# Patient Record
Sex: Male | Born: 1995 | Race: Black or African American | Hispanic: No | Marital: Single | Smoking: Never smoker
Health system: Southern US, Community
[De-identification: ages and names within clinical notes are randomized; demographics above are authoritative.]

## PROBLEM LIST (undated history)

## (undated) DIAGNOSIS — G44009 Cluster headache syndrome, unspecified, not intractable: Secondary | ICD-10-CM

## (undated) DIAGNOSIS — F419 Anxiety disorder, unspecified: Secondary | ICD-10-CM

## (undated) DIAGNOSIS — K219 Gastro-esophageal reflux disease without esophagitis: Secondary | ICD-10-CM

## (undated) DIAGNOSIS — T7840XA Allergy, unspecified, initial encounter: Secondary | ICD-10-CM

## (undated) HISTORY — DX: Allergy, unspecified, initial encounter: T78.40XA

## (undated) HISTORY — DX: Anxiety disorder, unspecified: F41.9

## (undated) HISTORY — DX: Gastro-esophageal reflux disease without esophagitis: K21.9

---
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
## 2014-03-12 ENCOUNTER — Encounter: Payer: Self-pay | Admitting: Family Medicine

## 2014-03-12 ENCOUNTER — Emergency Department: Payer: Self-pay | Admitting: Emergency Medicine

## 2014-03-12 ENCOUNTER — Ambulatory Visit (INDEPENDENT_AMBULATORY_CARE_PROVIDER_SITE_OTHER): Payer: PRIVATE HEALTH INSURANCE | Admitting: Family Medicine

## 2014-03-12 VITALS — BP 117/65 | HR 70 | Temp 98.3°F | Resp 20 | Wt 202.0 lb

## 2014-03-12 DIAGNOSIS — M75100 Unspecified rotator cuff tear or rupture of unspecified shoulder, not specified as traumatic: Secondary | ICD-10-CM

## 2014-03-12 DIAGNOSIS — M67919 Unspecified disorder of synovium and tendon, unspecified shoulder: Secondary | ICD-10-CM

## 2014-03-12 DIAGNOSIS — M719 Bursopathy, unspecified: Secondary | ICD-10-CM

## 2014-03-12 LAB — CBC
HCT: 40.6 % (ref 40.0–52.0)
HGB: 13.7 g/dL (ref 13.0–18.0)
MCH: 28.3 pg (ref 26.0–34.0)
MCHC: 33.7 g/dL (ref 32.0–36.0)
MCV: 84 fL (ref 80–100)
Platelet: 177 10*3/uL (ref 150–440)
RBC: 4.83 10*6/uL (ref 4.40–5.90)
RDW: 13 % (ref 11.5–14.5)
WBC: 3.9 10*3/uL (ref 3.8–10.6)

## 2014-03-12 LAB — URINALYSIS, COMPLETE
Bacteria: NONE SEEN
Bilirubin,UR: NEGATIVE
Blood: NEGATIVE
GLUCOSE, UR: NEGATIVE mg/dL (ref 0–75)
LEUKOCYTE ESTERASE: NEGATIVE
Nitrite: NEGATIVE
PH: 5 (ref 4.5–8.0)
PROTEIN: NEGATIVE
SQUAMOUS EPITHELIAL: NONE SEEN
Specific Gravity: 1.031 (ref 1.003–1.030)

## 2014-03-12 LAB — BASIC METABOLIC PANEL
Anion Gap: 7 (ref 7–16)
BUN: 13 mg/dL (ref 9–21)
CALCIUM: 8.4 mg/dL — AB (ref 9.0–10.7)
Chloride: 106 mmol/L (ref 97–107)
Co2: 27 mmol/L — ABNORMAL HIGH (ref 16–25)
Creatinine: 1.03 mg/dL (ref 0.60–1.30)
EGFR (African American): >60
EGFR (Non-African Amer.): >60
Glucose: 86 mg/dL (ref 65–99)
Osmolality: 279 (ref 275–301)
POTASSIUM: 3.5 mmol/L (ref 3.3–4.7)
SODIUM: 140 mmol/L (ref 132–141)

## 2014-03-12 NOTE — Assessment & Plan Note (Signed)
The rotator cuff injury with signs of impingement, full range of motion With limitation of his usual function I offered steroid injection and perform that today. Discussed with him that this would be reasonable once, if he continues to have pain or has worsening pain would recommend sending him to sports medicine for further evaluation I also gave him a home exercise plan, and cautioned him against any exercises that hurt his shoulder

## 2014-03-12 NOTE — Progress Notes (Signed)
Patient ID: Vincent Valenzuela, male   DOB: 01/01/1996, 18 y.o.   MRN: [id number]  Vincent FentonSamuel Bradshaw, MD Phone: [phone]  Subjective:  Chief complaint-noted  Pt Here for shoulder pain  He states the shoulder began hurting about 2 months ago after a mild injury during basketball game. He felt a slight pain whenever he is passing a ball Planning, and then had severe pain afterwards. He states the pain has continued to hurt across his anterior shoulder with activities and now he feels he has reduced range of motion. 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 Other than the basketball injury is her memory any other injuries.  ROS-  Per history of present illness  Past Medical History Patient Active Problem List   Diagnosis Date Noted  . Back pain 10/04/2013  . Social anxiety disorder 06/14/2013    Medications- reviewed and updated Current Outpatient Prescriptions  Medication Sig Dispense Refill  . Multiple Vitamins-Minerals (MULTIVITAMINS THER. W/MINERALS) TABS Take 1 tablet by mouth daily.       No current facility-administered medications for this visit.    Objective: BP 117/65  Pulse 70  Temp(Src) 98.3 F (36.8 C) (Oral)  Resp 20  Wt 202 lb (91.627 kg)  SpO2 100% Gen: NAD, alert, cooperative with exam HEENT: NCAT, MSK: Mild tenderness to palpation across the anterior area of the shoulder No gross deformity or redness or injury. Pain with Hawkins and empty can test Full range of motion with forward flexion, abduction, and Apley scratch test No pain with external or internal rotation   Informed consent obtained and placed in chart.  Time out performed.  Area cleaned with alcohol X 2.  Using 21 1/2 gauge needle 1 cc Kenalog and 4 cc's 1% Lidocaine were injected in subacromial space via posterior approach.  Sterile bandage placed.  Patient tolerated procedure well.  No complications.    Assessment/Plan:  Rotator cuff syndrome The  rotator cuff injury with signs of impingement, full range of motion With limitation of his usual function I offered steroid injection and perform that today. Discussed with him that this would be reasonable once, if he continues to have pain or has worsening pain would recommend sending him to sports medicine for further evaluation I also gave him a home exercise plan, and cautioned him against any exercises that hurt his shoulder

## 2014-03-12 NOTE — Patient Instructions (Signed)
Great to see you today!  You have rotator cuff syndrome, the shot will help, also do the exercises.

## 2014-06-08 ENCOUNTER — Emergency Department: Payer: Self-pay | Admitting: Emergency Medicine

## 2015-06-26 ENCOUNTER — Ambulatory Visit (INDEPENDENT_AMBULATORY_CARE_PROVIDER_SITE_OTHER): Payer: PRIVATE HEALTH INSURANCE | Admitting: Family Medicine

## 2015-06-26 ENCOUNTER — Encounter: Payer: Self-pay | Admitting: Family Medicine

## 2015-06-26 VITALS — BP 124/54 | HR 52 | Temp 98.1°F | Wt 185.0 lb

## 2015-06-26 DIAGNOSIS — S46812A Strain of other muscles, fascia and tendons at shoulder and upper arm level, left arm, initial encounter: Secondary | ICD-10-CM | POA: Diagnosis not present

## 2015-06-26 DIAGNOSIS — F4010 Social phobia, unspecified: Secondary | ICD-10-CM | POA: Diagnosis not present

## 2015-06-26 MED ORDER — MELOXICAM 15 MG PO TABS: 15.0000 mg | ORAL_TABLET | Freq: Every day | ORAL | Status: DC

## 2015-06-26 NOTE — Patient Instructions (Signed)
Thank you for coming in today!  We are not concerned about any potential bone injury.You have left sided trapezius spasm. Meloxicam can help to decrease the inflammation. Take one a day until the pain is resolved. You are okay to wrestle from a medical stand point the only thing is that the pain may be aggravated and this is your choice to make.  For your anxiety make an appointment with Dr. Cheron Schaumann at the front desk.   Thank you again.    Soft Tissue Injury of the Neck A soft tissue injury of the neck may be either blunt or penetrating. A blunt injury does not break the skin. A penetrating injury breaks the skin, creating an open wound. Blunt injuries may happen in several ways. Most involve some type of direct blow to the neck. This can cause serious injury to the windpipe, voice box, cervical spine, or esophagus. In some cases, the injury to the soft tissue can also result in a break (fracture) of the cervical spine.  Soft tissue injuries of the neck require immediate medical care. Sometimes, you may not notice the signs of injury right away. You may feel fine at first, but the swelling may eventually close off your airway. This could result in a significant or life-threatening injury. This is rare, but it is important to keep in mind with any injury to the neck.  CAUSES  Causes of blunt injury may include:  "Clothesline" injuries. This happens when someone is moving at high speed and runs into a clothesline, outstretched arm, or similar object. This results in a direct injury to the front of the neck. If the airway is blocked, it can cause suffocation due to lack of oxygen (asphyxiation) or even instant death.  High-energy trauma. This includes injuries from motor vehicle crashes, falling from a great height, or heavy objects falling onto the neck.  Sports-related injuries. Injury to the windpipe and voice box can result from being struck by another player or being struck by an object,  such as a baseball, hockey stick, or an outstretched arm.  Strangulation. This type of injury may cause skin trauma, hoarseness of voice, or broken cartilage in the voice box or windpipe. It may also cause a serious airway problem. SYMPTOMS   Bruising.  Pain and tenderness in the neck.  Swelling of the neck and face.  Hoarseness of voice.  Pain or difficulty with swallowing.  Drooling or inability to swallow.  Trouble breathing. This may become worse when lying flat.  Coughing up blood.  High-pitched, harsh, vibratory noise due to partial obstruction of the windpipe (stridor).  Swelling of the upper arms.  Windpipe that appears to be pushed off to one side.  Air in the tissues under the skin of the neck or chest (subcutaneous emphysema). This usually indicates a problem with the normal airway and is a medical emergency. DIAGNOSIS   If possible, your caregiver may ask about the details of how the injury occurred. A detailed exam can help to identify specific areas of the neck that are injured.  Your caregiver may ask for tests to rule out injury of the voice box, airway, or esophagus. This may include X-rays, ultrasounds, CT scans, or MRI scans, depending on the severity of your injury. TREATMENT  If you have an injury to your windpipe or voice box, immediate medical care is required. In almost all cases, hospitalization is necessary. For injuries that do not appear to require surgery, it is helpful to have medical  observation for 24 hours. You may be asked to do one or more of the following:  Rest your voice.  Bed rest.  Limit your diet, depending on the extent of the injury. Follow your caregiver's dietary guidelines. Often, only fluids and soft foods are recommended.  Keep your head raised.  Breathe humidified air.  Take medicines to control infection, reduce swelling, and reduce normal stomach acid. You may also need pain medicine, depending on your injury. For  injuries that appear to require surgery, you will need to stay in the hospital. The exact type of procedure needed will depend on your exact injury or injuries.  HOME CARE INSTRUCTIONS   If the skin was broken, keep the wound area clean and dry. Wear your bandage (dressing) and care for your wound as instructed.  Follow your caregiver's advice about your diet.  Follow your caregiver's advice about use of your voice.  Take medicines as directed.  Keep your head and neck at least partially raised (elevated) while recovering. This should also be done while sleeping. SEEK MEDICAL CARE IF:   Your voice becomes weaker.  Your swelling or bruising is not improving as expected. Typically, this takes several days to improve.  You feel that you are having problems with medicines prescribed.  You have drainage from the injury site. This may be a sign that your wound is not healing properly or is infected.  You develop increasing pain or difficulty while swallowing.  You develop an oral temperature of 102 F (38.9 C) or higher. SEEK IMMEDIATE MEDICAL CARE IF:   You cough up blood.  You develop sudden trouble breathing.  You cannot tolerate your oral medicines, or you are unable to swallow.  You develop drooling.  You have new or worsening vomiting.  You develop sudden, new swelling of the neck or face.  You have an oral temperature above 102 F (38.9 C), not controlled by medicine. MAKE SURE YOU:  Understand these instructions.  Will watch your condition.  Will get help right away if you are not doing well or get worse. Document Released: 12/20/2007 Document Revised: 12/05/2011 Document Reviewed: 11/29/2010 [facility] Patient Information 2015 [place], [place]. This information is not intended to replace advice given to you by your health care provider. Make sure you discuss any questions you have with your health care provider.

## 2015-06-26 NOTE — Assessment & Plan Note (Signed)
Patient was asking about help/medication for his anxiety. At this time I do not believe that it is warranted to treat this anxiety as patient has only had a recurrence of symptoms over the past 24 hours. In our office there is no indication of debilitating ailment.   I have asked that he follow-up with his primary care provider to have further workup on this matter.
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
## 2015-06-26 NOTE — Assessment & Plan Note (Addendum)
Upper Level Addendum:  I have seen and evaluated this patient along with this medical student and reviewed the complete note. I have made any necessary additions/revisions to the note and physical exam.   Patient signs and symptoms most consistent this time with trapezius strain, left-sided. There are no symptoms presenting at this time suggesting of bony or neurological compromise. I prescribed patient meloxicam 15 mg. He is to take this daily for the next 5 days, then when necessary after that. I've encouraged him to use ice and with heat liberally and regularly for pain/discomfort. Patient was concerned about his clearance to wrestle this weekend. At this time there is no indication for increased risk of injury as long as patient's pain was minimal and range of motion was full.  Kathee Delton, MD,MS,  PGY2 06/26/2015 9:08 PM

## 2015-09-18 ENCOUNTER — Emergency Department (HOSPITAL_COMMUNITY)
Admission: EM | Admit: 2015-09-18 | Discharge: 2015-09-18 | Disposition: A | Discharge status: Home / Self Care | Payer: PRIVATE HEALTH INSURANCE | Attending: Emergency Medicine | Admitting: Emergency Medicine

## 2015-09-18 ENCOUNTER — Emergency Department (HOSPITAL_COMMUNITY): Payer: PRIVATE HEALTH INSURANCE

## 2015-09-18 ENCOUNTER — Encounter (HOSPITAL_COMMUNITY): Payer: Self-pay | Admitting: Family Medicine

## 2015-09-18 DIAGNOSIS — Z8669 Personal history of other diseases of the nervous system and sense organs: Secondary | ICD-10-CM | POA: Insufficient documentation

## 2015-09-18 DIAGNOSIS — Z8659 Personal history of other mental and behavioral disorders: Secondary | ICD-10-CM | POA: Insufficient documentation

## 2015-09-18 DIAGNOSIS — Z8719 Personal history of other diseases of the digestive system: Secondary | ICD-10-CM | POA: Insufficient documentation

## 2015-09-18 DIAGNOSIS — R1011 Right upper quadrant pain: Secondary | ICD-10-CM | POA: Insufficient documentation

## 2015-09-18 DIAGNOSIS — Z79899 Other long term (current) drug therapy: Secondary | ICD-10-CM | POA: Insufficient documentation

## 2015-09-18 LAB — URINALYSIS, ROUTINE W REFLEX MICROSCOPIC
Bilirubin Urine: NEGATIVE
GLUCOSE, UA: NEGATIVE mg/dL
Hgb urine dipstick: NEGATIVE
KETONES UR: NEGATIVE mg/dL
LEUKOCYTES UA: NEGATIVE
NITRITE: NEGATIVE
PROTEIN: NEGATIVE mg/dL
Specific Gravity, Urine: 1.024 (ref 1.005–1.030)
pH: 7 (ref 5.0–8.0)

## 2015-09-18 LAB — CBC
HCT: 40.9 % (ref 39.0–52.0)
Hemoglobin: 13.4 g/dL (ref 13.0–17.0)
MCH: 27.8 pg (ref 26.0–34.0)
MCHC: 32.8 g/dL (ref 30.0–36.0)
MCV: 84.9 fL (ref 78.0–100.0)
PLATELETS: 223 10*3/uL (ref 150–400)
RBC: 4.82 MIL/uL (ref 4.22–5.81)
RDW: 13.2 % (ref 11.5–15.5)
WBC: 6.6 10*3/uL (ref 4.0–10.5)

## 2015-09-18 LAB — COMPREHENSIVE METABOLIC PANEL
ALK PHOS: 48 U/L (ref 38–126)
ALT: 14 U/L — AB (ref 17–63)
AST: 17 U/L (ref 15–41)
Albumin: 4.1 g/dL (ref 3.5–5.0)
Anion gap: 7 (ref 5–15)
BILIRUBIN TOTAL: 0.8 mg/dL (ref 0.3–1.2)
BUN: 14 mg/dL (ref 6–20)
CALCIUM: 9.6 mg/dL (ref 8.9–10.3)
CO2: 29 mmol/L (ref 22–32)
CREATININE: 1 mg/dL (ref 0.61–1.24)
Chloride: 107 mmol/L (ref 101–111)
GFR calc Af Amer: >60 mL/min (ref >60)
Glucose, Bld: 98 mg/dL (ref 65–99)
Potassium: 3.8 mmol/L (ref 3.5–5.1)
Sodium: 143 mmol/L (ref 135–145)
TOTAL PROTEIN: 7.6 g/dL (ref 6.5–8.1)

## 2015-09-18 LAB — LIPASE, BLOOD: Lipase: 25 U/L (ref 11–51)

## 2015-09-18 MED ORDER — ONDANSETRON HCL 4 MG PO TABS: 4.0000 mg | ORAL_TABLET | Freq: Four times a day (QID) | ORAL | Status: DC

## 2015-09-18 NOTE — ED Provider Notes (Signed)
CSN: [id number]     Arrival date & time 09/18/15  1252 History   First MD Initiated Contact with Patient 09/18/15 1322     Chief Complaint  Patient presents with  . Abdominal Pain    HPI   19 year old male presents today with abdominal pain. Patient reports symptoms started approximately one week ago with a slow onset intermittent stabbing sensation throughout his abdomen. He reports symptoms are more consistent in the right upper quadrant, but appear throughout his abdomen diffusely. He reports symptoms wax and wane, and are made worse from going sitting to standing or walking. He reports 2 days ago he had an episode of vomiting, denies any associated fever, chills, upper respiratory complaints, cough, shortness of breath or worsening abdominal pain at that time. He reports abdominal pain is not made worse by eating or drinking, or having bowel movements. He reports normal bowel movements with no changes in the color clarity or characteristics of urine or bowel movements. Patient denies any alcohol or drug use, reports using ibuprofen for pain with no significant improvement. 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   Past Medical History  Diagnosis Date  . Headaches, cluster   . Allergy   . Anxiety   . GERD (gastroesophageal reflux disease)    History reviewed. No pertinent past surgical history. Family History  Problem Relation Age of Onset  . Lupus Mother   . Depression Mother   . Depression Father   . Alcohol abuse Father   . Hypertension Father   . Lupus Maternal Grandmother   . Diabetes Maternal Grandmother   . Hypertension Maternal Grandmother   . Hyperlipidemia Maternal Grandmother   . Diabetes Maternal Grandfather   . Hypertension Maternal Grandfather   . Hyperlipidemia Maternal Grandfather   . Diabetes Paternal Grandmother   . Hypertension Paternal Grandmother   . Hyperlipidemia Paternal Grandmother    . Diabetes Paternal Grandfather   . Hypertension Paternal Grandfather   . Hyperlipidemia Paternal Grandfather    Social History  Substance Use Topics  . Smoking status: Passive Smoke Exposure - Never Smoker  . Smokeless tobacco: None  . Alcohol Use: None    Review of Systems  All other systems reviewed and are negative.   Allergies  Review of patient's allergies indicates no known allergies.  Home Medications   Prior to Admission medications   Medication Sig Start Date End Date Taking? Authorizing Provider  ibuprofen (ADVIL,MOTRIN) 200 MG tablet Take 200 mg by mouth every 6 (six) hours as needed for mild pain.   Yes Historical Provider, MD  Multiple Vitamins-Minerals (MULTIVITAMINS THER. W/MINERALS) TABS Take 1 tablet by mouth daily.   Yes Historical Provider, MD  ondansetron (ZOFRAN) 4 MG tablet Take 1 tablet (4 mg total) by mouth every 6 (six) hours. 09/18/15   Meiya Wisler, PA-C   BP 125/74 mmHg  Pulse 49  Temp(Src) 98 F (36.7 C) (Oral)  Resp 16  SpO2 100%   Physical Exam  Constitutional: He is oriented to person, place, and time. He appears well-developed and well-nourished.  HENT:  Head: Normocephalic and atraumatic.  Eyes: Conjunctivae are normal. Pupils are equal, round, and reactive to light. Right eye exhibits no discharge. Left eye exhibits no discharge. No scleral icterus.  Neck: Normal range of motion. No JVD present. No tracheal deviation present.  Cardiovascular: Regular rhythm, normal heart sounds and intact distal pulses.  Exam reveals no gallop and no  friction rub.   No murmur heard. Pulmonary/Chest: Effort normal and breath sounds normal. No stridor. No respiratory distress. He has no wheezes. He has no rales. He exhibits no tenderness.  Abdominal: Soft. Bowel sounds are normal.  Tenderness to palpation of the abdomen diffusely, no rebound guarding. Patient has more significant tenderness to the right upper quadrant. No distention, masses noted, no  rash or signs trauma to the abdomen.  Musculoskeletal: Normal range of motion. He exhibits no edema or tenderness.  Neurological: He is alert and oriented to person, place, and time. Coordination normal.  Skin: Skin is warm and dry. No rash noted. No erythema. No pallor.  Psychiatric: He has a normal mood and affect. His behavior is normal. Judgment and thought content normal.  Nursing note and vitals reviewed.     ED Course  Procedures (including critical care time) Labs Review Labs Reviewed  COMPREHENSIVE METABOLIC PANEL - Abnormal; Notable for the following:    ALT 14 (*)    All other components within normal limits  LIPASE, BLOOD  CBC  URINALYSIS, ROUTINE W REFLEX MICROSCOPIC (NOT [facility])    Imaging Review [place] Abdomen Complete  09/18/2015  CLINICAL DATA:  Ten days of right upper quadrant abdominal pain EXAM: ABDOMEN ULTRASOUND COMPLETE COMPARISON:  None in PACs FINDINGS: Gallbladder: No gallstones or wall thickening visualized. No sonographic Murphy sign noted by sonographer. Common bile duct: Diameter: 3.4 mm Liver: No focal lesion identified. Within normal limits in parenchymal echogenicity. IVC: No abnormality visualized. Pancreas: Visualized portion unremarkable. Spleen: Size and appearance within normal limits. Right Kidney: Length: 10.5 cm. Echogenicity within normal limits. No mass or hydronephrosis visualized. Left Kidney: Length: 11.2 cm. Echogenicity within normal limits. No mass or hydronephrosis visualized. Abdominal aorta: No aneurysm visualized. Other findings: No ascites is demonstrated. IMPRESSION: No gallstones or other acute intra-abdominal abnormality is demonstrated. If there are strong clinical concerns of gallbladder dysfunction, a nuclear medicine hepatobiliary scan may be useful. Electronically Signed   By: David  SwazilandJordan M.D.   On: 09/18/2015 15:47   I have personally reviewed and evaluated these images and lab results as part of my medical decision-making.    EKG Interpretation None      MDM   Final diagnoses:  Right upper quadrant pain    Labs: Urinalysis, lipase, CMP, CBC- no significant findings  Imaging: Ultrasound abdomen complete- no acute intra-abdominal abnormality  Consults:  Therapeutics:  Discharge Meds:   Assessment/Plan: Patient presents with abdominal pain over the last week. Symptoms most consistent with muscular abdominal pain as this is made worse with walking or going from sitting to standing. Not made worse with ingestion of foods, no satiety diarrhea, fever. Patient did have one episode of vomiting, this is likely an isolated event. Nonsurgical abdomen, no significant findings on ultrasound or laboratory data they would assess take further evaluation or management here in the ED. Patient is instructed to use ibuprofen or Tylenol for discomfort, avoid any heavy lifting activities, follow-up with his primary care provider in 3 days for reevaluation. He is given strict return precautions the event that new or worsening signs or symptoms present, both the patient and his mother who was present at the time of evaluation understood and agreed to today's plan.         Eyvonne MechanicJeffrey Verdell Kincannon, PA-C 09/18/15 1612  Lyndal Pulleyaniel Knott, MD 09/19/15 [phone]

## 2015-09-18 NOTE — ED Notes (Signed)
Patient transported to Ultrasound 

## 2015-09-18 NOTE — ED Notes (Signed)
Pt here for RUQ pain x 1 week with diarrhea and black stools.

## 2015-09-18 NOTE — Discharge Instructions (Signed)
Abdominal Pain, Adult °Many things can cause abdominal pain. Usually, abdominal pain is not caused by a disease and will improve without treatment. It can often be observed and treated at home. Your health care provider will do a physical exam and possibly order blood tests and X-rays to help determine the seriousness of your pain. However, in many cases, more time must pass before a clear cause of the pain can be found. Before that point, your health care provider may not know if you need more testing or further treatment. °HOME CARE INSTRUCTIONS °Monitor your abdominal pain for any changes. The following actions may help to alleviate any discomfort you are experiencing: °· Only take over-the-counter or prescription medicines as directed by your health care provider. °· Do not take laxatives unless directed to do so by your health care provider. °· Try a clear liquid diet (broth, tea, or water) as directed by your health care provider. Slowly move to a bland diet as tolerated. °SEEK MEDICAL CARE IF: °· You have unexplained abdominal pain. °· You have abdominal pain associated with nausea or diarrhea. °· You have pain when you urinate or have a bowel movement. °· You experience abdominal pain that wakes you in the night. °· You have abdominal pain that is worsened or improved by eating food. °· You have abdominal pain that is worsened with eating fatty foods. °· You have a fever. °SEEK IMMEDIATE MEDICAL CARE IF: °· Your pain does not go away within 2 hours. °· You keep throwing up (vomiting). °· Your pain is felt only in portions of the abdomen, such as the right side or the left lower portion of the abdomen. °· You pass bloody or black tarry stools. °MAKE SURE YOU: °· Understand these instructions. °· Will watch your condition. °· Will get help right away if you are not doing well or get worse. °  °This information is not intended to replace advice given to you by your health care provider. Make sure you discuss  any questions you have with your health care provider. °  °Document Released: 06/22/2005 Document Revised: 06/03/2015 Document Reviewed: 05/22/2013 °Elsevier Interactive Patient Education ©2016 [employer] ° °Please read attached information. If you experience any new or worsening signs or symptoms please return to the emergency room for evaluation. Please follow-up with your primary care provider or specialist as discussed. Please use medication prescribed only as directed and discontinue taking if you have any concerning signs or symptoms.  ° °

## 2015-09-24 ENCOUNTER — Emergency Department (HOSPITAL_COMMUNITY): Payer: No Typology Code available for payment source

## 2015-09-24 ENCOUNTER — Emergency Department (HOSPITAL_COMMUNITY)
Admission: EM | Admit: 2015-09-24 | Discharge: 2015-09-24 | Disposition: A | Discharge status: Home / Self Care | Payer: No Typology Code available for payment source | Attending: Emergency Medicine | Admitting: Emergency Medicine

## 2015-09-24 ENCOUNTER — Encounter (HOSPITAL_COMMUNITY): Payer: Self-pay

## 2015-09-24 DIAGNOSIS — Z79899 Other long term (current) drug therapy: Secondary | ICD-10-CM | POA: Insufficient documentation

## 2015-09-24 DIAGNOSIS — Z8669 Personal history of other diseases of the nervous system and sense organs: Secondary | ICD-10-CM | POA: Insufficient documentation

## 2015-09-24 DIAGNOSIS — R079 Chest pain, unspecified: Secondary | ICD-10-CM | POA: Diagnosis present

## 2015-09-24 DIAGNOSIS — R0789 Other chest pain: Secondary | ICD-10-CM | POA: Insufficient documentation

## 2015-09-24 DIAGNOSIS — Z8719 Personal history of other diseases of the digestive system: Secondary | ICD-10-CM | POA: Diagnosis not present

## 2015-09-24 DIAGNOSIS — Z8659 Personal history of other mental and behavioral disorders: Secondary | ICD-10-CM | POA: Diagnosis not present

## 2015-09-24 MED ORDER — CYCLOBENZAPRINE HCL 10 MG PO TABS: 10.0000 mg | ORAL_TABLET | Freq: Two times a day (BID) | ORAL | Status: DC | PRN

## 2015-09-24 MED ORDER — NAPROXEN 500 MG PO TABS: 500.0000 mg | ORAL_TABLET | Freq: Two times a day (BID) | ORAL | Status: DC

## 2015-09-24 NOTE — ED Notes (Signed)
Pt c/o chest pain with shortness of breath. ?loc. Friend states fell to ground.

## 2015-09-24 NOTE — ED Provider Notes (Signed)
CSN: [id number]     Arrival date & time 09/24/15  [id number] History   First MD Initiated Contact with Patient 09/24/15 0740     Chief Complaint  Patient presents with  . Chest Pain     (Consider location/radiation/quality/duration/timing/severity/associated sxs/prior Treatment) HPI...... left anterior chest pain since 4:30 AM described as sharp and burning. No dyspnea, nausea, diaphoresis. He is slightly dizzy. Nonsmoker. No family history of MI. His taken ibuprofen and Zofran. Severity is mild. Nothing makes symptoms better or worse.  Past Medical History  Diagnosis Date  . Headaches, cluster   . Allergy   . Anxiety   . GERD (gastroesophageal reflux disease)    History reviewed. No pertinent past surgical history. Family History  Problem Relation Age of Onset  . Lupus Mother   . Depression Mother   . Depression Father   . Alcohol abuse Father   . Hypertension Father   . Lupus Maternal Grandmother   . Diabetes Maternal Grandmother   . Hypertension Maternal Grandmother   . Hyperlipidemia Maternal Grandmother   . Diabetes Maternal Grandfather   . Hypertension Maternal Grandfather   . Hyperlipidemia Maternal Grandfather   . Diabetes Paternal Grandmother   . Hypertension Paternal Grandmother   . Hyperlipidemia Paternal Grandmother   . Diabetes Paternal Grandfather   . Hypertension Paternal Grandfather   . Hyperlipidemia Paternal Grandfather    Social History  Substance Use Topics  . Smoking status: Passive Smoke Exposure - Never Smoker  . Smokeless tobacco: None  . Alcohol Use: No    Review of Systems  All other systems reviewed and are negative.     Allergies  Review of patient's allergies indicates no known allergies.  Home Medications   Prior to Admission medications   Medication Sig Start Date End Date Taking? Authorizing Provider  ibuprofen (ADVIL,MOTRIN) 200 MG tablet Take 200 mg by mouth every 6 (six) hours as needed for mild pain.   Yes Historical  Provider, MD  Multiple Vitamins-Minerals (MULTIVITAMINS THER. W/MINERALS) TABS Take 1 tablet by mouth daily.   Yes Historical Provider, MD  ondansetron (ZOFRAN) 4 MG tablet Take 1 tablet (4 mg total) by mouth every 6 (six) hours. 09/18/15  Yes Jeffrey Hedges, PA-C  cyclobenzaprine (FLEXERIL) 10 MG tablet Take 1 tablet (10 mg total) by mouth 2 (two) times daily as needed for muscle spasms. 09/24/15   Donnetta HutchingBrian Lanai Conlee, MD  naproxen (NAPROSYN) 500 MG tablet Take 1 tablet (500 mg total) by mouth 2 (two) times daily. 09/24/15   Donnetta HutchingBrian Steele Stracener, MD   BP 120/56 mmHg  Pulse 51  Temp(Src) 98.8 F (37.1 C) (Oral)  Resp 14  SpO2 99% Physical Exam  Constitutional: He is oriented to person, place, and time. He appears well-developed and well-nourished.  HENT:  Head: Normocephalic and atraumatic.  Eyes: Conjunctivae and EOM are normal. Pupils are equal, round, and reactive to light.  Neck: Normal range of motion. Neck supple.  Cardiovascular: Normal rate and regular rhythm.   Pulmonary/Chest: Effort normal and breath sounds normal.  Abdominal: Soft. Bowel sounds are normal.  Musculoskeletal: Normal range of motion.  Neurological: He is alert and oriented to person, place, and time.  Skin: Skin is warm and dry.  Psychiatric: He has a normal mood and affect. His behavior is normal.  Nursing note and vitals reviewed.   ED Course  Procedures (including critical care time) Labs Review Labs Reviewed - No data to display  Imaging Review Dg Chest 2 View  09/24/2015  CLINICAL DATA:  Woke up this morning with LEFT side chest pain, history GERD EXAM: CHEST  2 VIEW COMPARISON:  09/04/2005 FINDINGS: Upper normal heart size. Mediastinal contours and pulmonary vascularity normal. Lungs clear. No pleural effusion or pneumothorax. Osseous mineralization normal without focal bony abnormality. IMPRESSION: No acute abnormalities. Electronically Signed   By: Ulyses Southward M.D.   On: 09/24/2015 08:23   I have personally  reviewed and evaluated these images and lab results as part of my medical decision-making.   EKG Interpretation   Date/Time:  Thursday September 24 2015 07:09:06 EST Ventricular Rate:  52 PR Interval:  132 QRS Duration: 95 QT Interval:  413 QTC Calculation: 384 R Axis:   70 Text Interpretation:  Sinus bradycardia Premature atrial complexes  Otherwise normal ECG Confirmed by DELO  MD, DOUGLAS ([id number]) on 09/24/2015  7:19:37 AM      MDM   Final diagnoses:  Chest wall pain    Patient is in no acute distress. He is low risk for ACS or pulmonary embolism. EKG and chest x-ray normal. Discharge medication Naprosyn 500 mg and Flexeril 10 mg    Donnetta Hutching, MD 09/24/15 1103

## 2015-09-24 NOTE — Discharge Instructions (Signed)
Chest Wall Pain Chest wall pain is pain in or around the bones and muscles of your chest. Sometimes, an injury causes this pain. Sometimes, the cause may not be known. This pain may take several weeks or longer to get better. HOME CARE Pay attention to any changes in your symptoms. Take these actions to help with your pain:  Rest as told by your doctor.  Avoid activities that cause pain. Try not to use your chest, belly (abdominal), or side muscles to lift heavy things.  If directed, apply ice to the painful area:  Put ice in a plastic bag.  Place a towel between your skin and the bag.  Leave the ice on for 20 minutes, 2-3 times per day.  Take over-the-counter and prescription medicines only as told by your doctor.  Do not use tobacco products, including cigarettes, chewing tobacco, and e-cigarettes. If you need help quitting, ask your doctor.  Keep all follow-up visits as told by your doctor. This is important. GET HELP IF:  You have a fever.  Your chest pain gets worse.  You have new symptoms. GET HELP RIGHT AWAY IF:  You feel sick to your stomach (nauseous) or you throw up (vomit).  You feel sweaty or light-headed.  You have a cough with phlegm (sputum) or you cough up blood.  You are short of breath.   This information is not intended to replace advice given to you by your health care provider. Make sure you discuss any questions you have with your health care provider.   Document Released: 02/29/2008 Document Revised: 06/03/2015 Document Reviewed: 12/08/2014 Elsevier Interactive Patient Education 2016 [employer]  EKG and chest x-ray were normal. I suspect the pain is in the wall of your chest. Medication for inflammation and muscle spasm.

## 2015-10-30 ENCOUNTER — Emergency Department (HOSPITAL_COMMUNITY)
Admission: EM | Admit: 2015-10-30 | Discharge: 2015-10-31 | Disposition: A | Discharge status: Home / Self Care | Payer: No Typology Code available for payment source | Attending: Emergency Medicine | Admitting: Emergency Medicine

## 2015-10-30 ENCOUNTER — Encounter (HOSPITAL_COMMUNITY): Payer: Self-pay

## 2015-10-30 DIAGNOSIS — R110 Nausea: Secondary | ICD-10-CM | POA: Diagnosis not present

## 2015-10-30 DIAGNOSIS — Z79899 Other long term (current) drug therapy: Secondary | ICD-10-CM | POA: Diagnosis not present

## 2015-10-30 DIAGNOSIS — Z791 Long term (current) use of non-steroidal anti-inflammatories (NSAID): Secondary | ICD-10-CM | POA: Diagnosis not present

## 2015-10-30 DIAGNOSIS — J029 Acute pharyngitis, unspecified: Secondary | ICD-10-CM | POA: Diagnosis present

## 2015-10-30 DIAGNOSIS — H9209 Otalgia, unspecified ear: Secondary | ICD-10-CM | POA: Diagnosis not present

## 2015-10-30 DIAGNOSIS — Z8719 Personal history of other diseases of the digestive system: Secondary | ICD-10-CM | POA: Insufficient documentation

## 2015-10-30 DIAGNOSIS — J069 Acute upper respiratory infection, unspecified: Secondary | ICD-10-CM | POA: Diagnosis not present

## 2015-10-30 DIAGNOSIS — Z8659 Personal history of other mental and behavioral disorders: Secondary | ICD-10-CM | POA: Diagnosis not present

## 2015-10-30 NOTE — ED Notes (Signed)
See providers assessment.  

## 2015-10-30 NOTE — ED Notes (Signed)
Pt complaining of body aches. States ears and throat are hurting. Denies any fevers or vomiting. Is nauseated.

## 2015-10-31 LAB — RAPID STREP SCREEN (MED CTR MEBANE ONLY): STREPTOCOCCUS, GROUP A SCREEN (DIRECT): NEGATIVE

## 2015-10-31 MED ORDER — PHENYLEPH-PROMETHAZINE-COD 5-6.25-10 MG/5ML PO SYRP
5.0000 mL | ORAL_SOLUTION | Freq: Four times a day (QID) | ORAL | Status: DC
Start: 2015-10-31 — End: 2017-07-06

## 2015-10-31 NOTE — ED Provider Notes (Signed)
CSN: [id number]     Arrival date & time 10/30/15  2335 History   First MD Initiated Contact with Patient 10/31/15 0000     Chief Complaint  Patient presents with  . Generalized Body Aches     (Consider location/radiation/quality/duration/timing/severity/associated sxs/prior Treatment) Patient is a 20 y.o. male presenting with URI. The history is provided by the patient. No language interpreter was used.  URI Presenting symptoms: congestion, ear pain, fatigue, rhinorrhea and sore throat   Severity:  Moderate Onset quality:  Gradual Duration:  3 days Timing:  Intermittent Progression:  Waxing and waning Chronicity:  New Ineffective treatments:  OTC medications Associated symptoms: myalgias     Past Medical History  Diagnosis Date  . Headaches, cluster   . Allergy   . Anxiety   . GERD (gastroesophageal reflux disease)    History reviewed. No pertinent past surgical history. Family History  Problem Relation Age of Onset  . Lupus Mother   . Depression Mother   . Depression Father   . Alcohol abuse Father   . Hypertension Father   . Lupus Maternal Grandmother   . Diabetes Maternal Grandmother   . Hypertension Maternal Grandmother   . Hyperlipidemia Maternal Grandmother   . Diabetes Maternal Grandfather   . Hypertension Maternal Grandfather   . Hyperlipidemia Maternal Grandfather   . Diabetes Paternal Grandmother   . Hypertension Paternal Grandmother   . Hyperlipidemia Paternal Grandmother   . Diabetes Paternal Grandfather   . Hypertension Paternal Grandfather   . Hyperlipidemia Paternal Grandfather    Social History  Substance Use Topics  . Smoking status: Passive Smoke Exposure - Never Smoker  . Smokeless tobacco: None  . Alcohol Use: No    Review of Systems  Constitutional: Positive for chills and fatigue.  HENT: Positive for congestion, ear pain, rhinorrhea and sore throat.   Gastrointestinal: Positive for nausea. Negative for vomiting.  Musculoskeletal:  Positive for myalgias.  All other systems reviewed and are negative.     Allergies  Review of patient's allergies indicates no known allergies.  Home Medications   Prior to Admission medications   Medication Sig Start Date End Date Taking? Authorizing Provider  cyclobenzaprine (FLEXERIL) 10 MG tablet Take 1 tablet (10 mg total) by mouth 2 (two) times daily as needed for muscle spasms. 09/24/15   Donnetta Hutching, MD  ibuprofen (ADVIL,MOTRIN) 200 MG tablet Take 200 mg by mouth every 6 (six) hours as needed for mild pain.    Historical Provider, MD  Multiple Vitamins-Minerals (MULTIVITAMINS THER. W/MINERALS) TABS Take 1 tablet by mouth daily.    Historical Provider, MD  naproxen (NAPROSYN) 500 MG tablet Take 1 tablet (500 mg total) by mouth 2 (two) times daily. 09/24/15   Donnetta Hutching, MD  ondansetron (ZOFRAN) 4 MG tablet Take 1 tablet (4 mg total) by mouth every 6 (six) hours. 09/18/15   Jeffrey Hedges, PA-C   BP 133/64 mmHg  Pulse 63  Temp(Src) 99.2 F (37.3 C) (Oral)  Resp 16  SpO2 100% Physical Exam  Constitutional: He is oriented to person, place, and time. He appears well-developed and well-nourished.  HENT:  Head: Normocephalic.  Mouth/Throat: Posterior oropharyngeal erythema present. No oropharyngeal exudate.  Eyes: Pupils are equal, round, and reactive to light.  Neck: Neck supple.  Cardiovascular: Normal rate and regular rhythm.   Pulmonary/Chest: Effort normal and breath sounds normal.  Abdominal: Soft.  Musculoskeletal: He exhibits no edema.  Lymphadenopathy:    He has no cervical adenopathy.  Neurological: He is alert  and oriented to person, place, and time. He has normal reflexes.  Skin: Skin is warm and dry. No rash noted.  Psychiatric: He has a normal mood and affect.  Nursing note and vitals reviewed.   ED Course  Procedures (including critical care time) Labs Review Labs Reviewed  RAPID STREP SCREEN (NOT AT [facility])  CULTURE, GROUP A STREP [facility])    Imaging  Review No results found. I have personally reviewed and evaluated these images and lab results as part of my medical decision-making.   EKG Interpretation None     Strep screen negative. MDM   Final diagnoses:  None    Upper respiratory infection. Care instructions provided. Return precautions discussed. Follow-up with PCP.    Felicie Morn, NP 10/31/15 1610  Tomasita Crumble, MD 10/31/15 [phone]

## 2015-11-02 LAB — CULTURE, GROUP A STREP (THRC)

## 2016-02-17 ENCOUNTER — Encounter (HOSPITAL_COMMUNITY): Payer: Self-pay | Admitting: Emergency Medicine

## 2016-02-17 ENCOUNTER — Ambulatory Visit (HOSPITAL_COMMUNITY)
Admission: EM | Admit: 2016-02-17 | Discharge: 2016-02-17 | Disposition: A | Discharge status: Home / Self Care | Payer: No Typology Code available for payment source | Attending: Family Medicine | Admitting: Family Medicine

## 2016-02-17 DIAGNOSIS — R1013 Epigastric pain: Secondary | ICD-10-CM | POA: Diagnosis not present

## 2016-02-17 DIAGNOSIS — J029 Acute pharyngitis, unspecified: Secondary | ICD-10-CM | POA: Diagnosis not present

## 2016-02-17 DIAGNOSIS — J069 Acute upper respiratory infection, unspecified: Secondary | ICD-10-CM | POA: Diagnosis present

## 2016-02-17 DIAGNOSIS — J302 Other seasonal allergic rhinitis: Secondary | ICD-10-CM | POA: Insufficient documentation

## 2016-02-17 LAB — POCT RAPID STREP A: Streptococcus, Group A Screen (Direct): NEGATIVE

## 2016-02-17 NOTE — Discharge Instructions (Signed)
Allergic Rhinitis For nasal and head congestion may take Sudafed PE 10 mg every 4 hours as needed. Saline nasal spray used frequently. For drainage may use Allegra, Claritin or Zyrtec. If you need stronger medicine to stop drainage may take Chlor-Trimeton 2-4 mg every 4 hours. This may cause drowsiness. Ibuprofen 600 mg every 6 hours as needed for pain, discomfort or fever. Drink plenty of fluids and stay well-hydrated.  Allergic rhinitis is when the mucous membranes in the nose respond to allergens. Allergens are particles in the air that cause your body to have an allergic reaction. This causes you to release allergic antibodies. Through a chain of events, these eventually cause you to release histamine into the blood stream. Although meant to protect the body, it is this release of histamine that causes your discomfort, such as frequent sneezing, congestion, and an itchy, runny nose.  CAUSES Seasonal allergic rhinitis (hay fever) is caused by pollen allergens that may come from grasses, trees, and weeds. Year-round allergic rhinitis (perennial allergic rhinitis) is caused by allergens such as house dust mites, pet dander, and mold spores. SYMPTOMS  Nasal stuffiness (congestion).  Itchy, runny nose with sneezing and tearing of the eyes. DIAGNOSIS Your health care provider can help you determine the allergen or allergens that trigger your symptoms. If you and your health care provider are unable to determine the allergen, skin or blood testing may be used. Your health care provider will diagnose your condition after taking your health history and performing a physical exam. Your health care provider may assess you for other related conditions, such as asthma, pink eye, or an ear infection. TREATMENT Allergic rhinitis does not have a cure, but it can be controlled by:  Medicines that block allergy symptoms. These may include allergy shots, nasal sprays, and oral antihistamines.  Avoiding the  allergen. Hay fever may often be treated with antihistamines in pill or nasal spray forms. Antihistamines block the effects of histamine. There are over-the-counter medicines that may help with nasal congestion and swelling around the eyes. Check with your health care provider before taking or giving this medicine. If avoiding the allergen or the medicine prescribed do not work, there are many new medicines your health care provider can prescribe. Stronger medicine may be used if initial measures are ineffective. Desensitizing injections can be used if medicine and avoidance does not work. Desensitization is when a patient is given ongoing shots until the body becomes less sensitive to the allergen. Make sure you follow up with your health care provider if problems continue. HOME CARE INSTRUCTIONS It is not possible to completely avoid allergens, but you can reduce your symptoms by taking steps to limit your exposure to them. It helps to know exactly what you are allergic to so that you can avoid your specific triggers. SEEK MEDICAL CARE IF:  You have a fever.  You develop a cough that does not stop easily (persistent).  You have shortness of breath.  You start wheezing.  Symptoms interfere with normal daily activities.   This information is not intended to replace advice given to you by your health care provider. Make sure you discuss any questions you have with your health care provider.   Document Released: 06/07/2001 Document Revised: 10/03/2014 Document Reviewed: 05/20/2013 Elsevier Interactive Patient Education 2016 Elsevier Inc.  Sore Throat A sore throat is pain, burning, irritation, or scratchiness of the throat. There is often pain or tenderness when swallowing or talking. A sore throat may be accompanied by other  symptoms, such as coughing, sneezing, fever, and swollen neck glands. A sore throat is often the first sign of another sickness, such as a cold, flu, strep throat, or  mononucleosis (commonly known as mono). Most sore throats go away without medical treatment. CAUSES  The most common causes of a sore throat include:  A viral infection, such as a cold, flu, or mono.  A bacterial infection, such as strep throat, tonsillitis, or whooping cough.  Seasonal allergies.  Dryness in the air.  Irritants, such as smoke or pollution.  Gastroesophageal reflux disease (GERD). HOME CARE INSTRUCTIONS   Only take over-the-counter medicines as directed by your caregiver.  Drink enough fluids to keep your urine clear or pale yellow.  Rest as needed.  Try using throat sprays, lozenges, or sucking on hard candy to ease any pain (if older than 4 years or as directed).  Sip warm liquids, such as broth, herbal tea, or warm water with honey to relieve pain temporarily. You may also eat or drink cold or frozen liquids such as frozen ice pops.  Gargle with salt water (mix 1 tsp salt with 8 oz of water).  Do not smoke and avoid secondhand smoke.  Put a cool-mist humidifier in your bedroom at night to moisten the air. You can also turn on a hot shower and sit in the bathroom with the door closed for 5-10 minutes. SEEK IMMEDIATE MEDICAL CARE IF:  You have difficulty breathing.  You are unable to swallow fluids, soft foods, or your saliva.  You have increased swelling in the throat.  Your sore throat does not get better in 7 days.  You have nausea and vomiting.  You have a fever or persistent symptoms for more than 2-3 days.  You have a fever and your symptoms suddenly get worse. MAKE SURE YOU:   Understand these instructions.  Will watch your condition.  Will get help right away if you are not doing well or get worse.   This information is not intended to replace advice given to you by your health care provider. Make sure you discuss any questions you have with your health care provider.   Document Released: 10/20/2004 Document Revised: 10/03/2014  Document Reviewed: 05/20/2012 Elsevier Interactive Patient Education [employer] Elsevier Inc.

## 2016-02-17 NOTE — ED Provider Notes (Signed)
CSN: [id number]     Arrival date & time 02/17/16  1338 History   First MD Initiated Contact with Patient 02/17/16 1516     Chief Complaint  Patient presents with  . URI   (Consider location/radiation/quality/duration/timing/severity/associated sxs/prior Treatment) HPI Comments: 20 year old male complaining of a sore throat for 3 days. It is associated with minor intermittent lightheadedness, cough, tinnitus, fullness in the years, weakness. Today he had epigastric pain associated with one episode of vomiting. Denies fever or chills.  Patient is a 20 y.o. male presenting with URI.  URI Presenting symptoms: congestion, cough, rhinorrhea and sore throat   Presenting symptoms: no ear pain, no fatigue and no fever   Associated symptoms: no neck pain     Past Medical History  Diagnosis Date  . Headaches, cluster   . Allergy   . Anxiety   . GERD (gastroesophageal reflux disease)    History reviewed. No pertinent past surgical history. Family History  Problem Relation Age of Onset  . Lupus Mother   . Depression Mother   . Depression Father   . Alcohol abuse Father   . Hypertension Father   . Lupus Maternal Grandmother   . Diabetes Maternal Grandmother   . Hypertension Maternal Grandmother   . Hyperlipidemia Maternal Grandmother   . Diabetes Maternal Grandfather   . Hypertension Maternal Grandfather   . Hyperlipidemia Maternal Grandfather   . Diabetes Paternal Grandmother   . Hypertension Paternal Grandmother   . Hyperlipidemia Paternal Grandmother   . Diabetes Paternal Grandfather   . Hypertension Paternal Grandfather   . Hyperlipidemia Paternal Grandfather    Social History  Substance Use Topics  . Smoking status: Passive Smoke Exposure - Never Smoker  . Smokeless tobacco: None  . Alcohol Use: No    Review of Systems  Constitutional: Positive for activity change. Negative for fever, chills, diaphoresis and fatigue.  HENT: Positive for congestion, postnasal drip,  rhinorrhea and sore throat. Negative for ear pain, facial swelling and trouble swallowing.   Eyes: Negative for pain, discharge and redness.  Respiratory: Positive for cough. Negative for chest tightness and shortness of breath.   Cardiovascular: Negative.   Gastrointestinal: Positive for vomiting and abdominal pain.       Abdominal pain was a brief period this morning. He does not complain of abdominal pain now.  Musculoskeletal: Negative.  Negative for neck pain and neck stiffness.  Skin: Negative.   Neurological: Negative.   All other systems reviewed and are negative.   Allergies  Review of patient's allergies indicates no known allergies.  Home Medications   Prior to Admission medications   Medication Sig Start Date End Date Taking? Authorizing Provider  cyclobenzaprine (FLEXERIL) 10 MG tablet Take 1 tablet (10 mg total) by mouth 2 (two) times daily as needed for muscle spasms. 09/24/15   Donnetta Hutching, MD  ibuprofen (ADVIL,MOTRIN) 200 MG tablet Take 200 mg by mouth every 6 (six) hours as needed for mild pain.    Historical Provider, MD  Multiple Vitamins-Minerals (MULTIVITAMINS THER. W/MINERALS) TABS Take 1 tablet by mouth daily.    Historical Provider, MD  naproxen (NAPROSYN) 500 MG tablet Take 1 tablet (500 mg total) by mouth 2 (two) times daily. 09/24/15   Donnetta Hutching, MD  ondansetron (ZOFRAN) 4 MG tablet Take 1 tablet (4 mg total) by mouth every 6 (six) hours. 09/18/15   Jeffrey Hedges, PA-C  Phenyleph-Promethazine-Cod 5-6.25-10 MG/5ML SYRP Take 5 mLs by mouth 4 (four) times daily. 10/31/15   Felicie Morn, NP  Meds Ordered and Administered this Visit  Medications - No data to display  BP 126/71 mmHg  Pulse 62  Temp(Src) 99 F (37.2 C) (Oral)  Resp 18  SpO2 100% No data found.   Physical Exam  Constitutional: He is oriented to person, place, and time. He appears well-developed and well-nourished. No distress.  HENT:  Head: Normocephalic and atraumatic.  Mouth/Throat: No  oropharyngeal exudate.  Bilateral TMs are mildly retracted. No erythema or effusion.  Oropharynx with minor erythema, cobblestoning and streaks of clear PND.  Eyes: Conjunctivae and EOM are normal.  Neck: Normal range of motion. Neck supple.  Cardiovascular: Normal rate, regular rhythm and normal heart sounds.   Pulmonary/Chest: Effort normal and breath sounds normal. No respiratory distress. He has no wheezes. He has no rales.  Abdominal: Soft. Bowel sounds are normal.  Minor tenderness to the epigastrium and left upper quadrant. No rebound or guarding. Much of the lower abdomen including the right hemiabdomen percusses dull.  Musculoskeletal: Normal range of motion. He exhibits no edema.  Lymphadenopathy:    He has no cervical adenopathy.  Neurological: He is alert and oriented to person, place, and time. He exhibits normal muscle tone.  Skin: Skin is warm and dry. No rash noted.  Psychiatric: He has a normal mood and affect.  Nursing note and vitals reviewed.   ED Course  Procedures (including critical care time)  Labs Review Labs Reviewed  POCT RAPID STREP A   Results for orders placed or performed during the hospital encounter of 02/17/16  POCT rapid strep A [facility]  Result Value Ref Range   Streptococcus, Group A Screen (Direct) NEGATIVE NEGATIVE     Imaging Review No results found.   Visual Acuity Review  Right Eye Distance:   Left Eye Distance:   Bilateral Distance:    Right Eye Near:   Left Eye Near:    Bilateral Near:         MDM   1. Other seasonal allergic rhinitis   2. Allergic pharyngitis   3. Epigastric pain    Allergic Rhinitis For nasal and head congestion may take Sudafed PE 10 mg every 4 hours as needed. Saline nasal spray used frequently. For drainage may use Allegra, Claritin or Zyrtec. If you need stronger medicine to stop drainage may take Chlor-Trimeton 2-4 mg every 4 hours. This may cause drowsiness. Ibuprofen 600 mg every  6 hours as needed for pain, discomfort or fever. Drink plenty of fluids and stay well-hydrated.  For redevelopment or worsening of stomach pain, additional vomiting seek medical attn promptly. Clear liquids for next 24 hours.    Hayden Rasmussenavid Brentton Wardlow, NP 02/17/16 [phone]

## 2016-02-17 NOTE — ED Notes (Signed)
C/o cold sx onset x4 days associated w/ST, weakness, BA, abd pain, bilateral ear ringing, and feeling LH Denies fevers, chills A&O x4... No acute distress.

## 2016-02-20 LAB — CULTURE, GROUP A STREP (THRC)

## 2016-02-22 ENCOUNTER — Telehealth (HOSPITAL_COMMUNITY): Payer: Self-pay | Admitting: Emergency Medicine

## 2016-02-22 NOTE — ED Notes (Signed)
Called pt and notified of recent lab results from visit 5/24 Pt ID'd properly... Reports feeling better but still having a ST  Per Dr. Piedad ClimesHonig,  Notes Recorded by Charm RingsErin J Honig, MD on 02/21/2016 at 5:10 PM [facility]. F/u if symptoms do not resolve  Adv pt if sx are not getting better to return  Pt verb understanding

## 2017-07-02 ENCOUNTER — Emergency Department
Admission: EM | Admit: 2017-07-02 | Discharge: 2017-07-02 | Disposition: A | Discharge status: Home / Self Care | Payer: Self-pay | Attending: Emergency Medicine | Admitting: Emergency Medicine

## 2017-07-02 ENCOUNTER — Emergency Department: Payer: Self-pay

## 2017-07-02 DIAGNOSIS — Z79899 Other long term (current) drug therapy: Secondary | ICD-10-CM | POA: Insufficient documentation

## 2017-07-02 DIAGNOSIS — R51 Headache: Secondary | ICD-10-CM | POA: Insufficient documentation

## 2017-07-02 DIAGNOSIS — Y999 Unspecified external cause status: Secondary | ICD-10-CM | POA: Insufficient documentation

## 2017-07-02 DIAGNOSIS — S161XXA Strain of muscle, fascia and tendon at neck level, initial encounter: Secondary | ICD-10-CM | POA: Insufficient documentation

## 2017-07-02 DIAGNOSIS — X500XXA Overexertion from strenuous movement or load, initial encounter: Secondary | ICD-10-CM | POA: Insufficient documentation

## 2017-07-02 DIAGNOSIS — Y929 Unspecified place or not applicable: Secondary | ICD-10-CM | POA: Insufficient documentation

## 2017-07-02 DIAGNOSIS — Z7722 Contact with and (suspected) exposure to environmental tobacco smoke (acute) (chronic): Secondary | ICD-10-CM | POA: Insufficient documentation

## 2017-07-02 DIAGNOSIS — Y9372 Activity, wrestling: Secondary | ICD-10-CM | POA: Insufficient documentation

## 2017-07-02 MED ORDER — KETOROLAC TROMETHAMINE 60 MG/2ML IM SOLN
60.0000 mg | Freq: Once | INTRAMUSCULAR | Status: AC
  Administered 2017-07-02: 60 mg via INTRAMUSCULAR
  Filled 2017-07-02: qty 2

## 2017-07-02 MED ORDER — METHOCARBAMOL 500 MG PO TABS: 500.0000 mg | ORAL_TABLET | Freq: Four times a day (QID) | ORAL | 0 refills | Status: DC

## 2017-07-02 MED ORDER — ORPHENADRINE CITRATE 30 MG/ML IJ SOLN
60.0000 mg | Freq: Once | INTRAMUSCULAR | Status: AC
  Administered 2017-07-02: 60 mg via INTRAMUSCULAR
  Filled 2017-07-02: qty 2

## 2017-07-02 MED ORDER — MELOXICAM 15 MG PO TABS: 15.0000 mg | ORAL_TABLET | Freq: Every day | ORAL | 0 refills | Status: DC

## 2017-07-02 NOTE — ED Triage Notes (Signed)
Pt reports was at wrestling, injured neck, c/o neck pain on both sides, pain 9/10.

## 2017-07-02 NOTE — ED Provider Notes (Signed)
[facility] Emergency Department Provider Note  ____________________________________________  Time seen: Approximately 6:48 PM  I have reviewed the triage vital signs and the nursing notes.   HISTORY  Chief Complaint Neck Pain    HPI Vincent Valenzuela is a 21 y.o. male ho presents emergency department complaining of neck pain and mild headache. Patient reports that he was at wrestling practice performed a wrestling move, landed on his neck. Patient reports that he heard a pop at the time and had sharp pain to the right side of the neck. Patient got up, continued to practice and then during the practice later on felt a pop to the left-sided neck and now has bilateral neck pain. Mild tingling in bilateral upper extremity but no numbness and no loss of sensation, grip strength, movement. Patient has a low-grade global headache. No visual changes, chest pain, shortness of breath, nausea, vomiting area no medications prior to arrival. No history of neck or head injury. C-collar was placed by triage. This is in place at this time.   Past Medical History:  Diagnosis Date  . Allergy   . Anxiety   . GERD (gastroesophageal reflux disease)   . Headaches, cluster     Patient Active Problem List   Diagnosis Date Noted  . Strain of left trapezius muscle 06/26/2015  . Rotator cuff syndrome 03/12/2014  . Back pain 10/04/2013  . Social anxiety disorder 06/14/2013    History reviewed. No pertinent surgical history.  Prior to Admission medications   Medication Sig Start Date End Date Taking? Authorizing Provider  cyclobenzaprine (FLEXERIL) 10 MG tablet Take 1 tablet (10 mg total) by mouth 2 (two) times daily as needed for muscle spasms. 09/24/15   Donnetta Hutching, MD  ibuprofen (ADVIL,MOTRIN) 200 MG tablet Take 200 mg by mouth every 6 (six) hours as needed for mild pain.    [provider]  meloxicam (MOBIC) 15 MG tablet Take 1 tablet (15 mg total) by mouth daily.  07/02/17   Markesha Hannig, Delorise Royals, PA-C  methocarbamol (ROBAXIN) 500 MG tablet Take 1 tablet (500 mg total) by mouth 4 (four) times daily. 07/02/17   Giani Winther, Delorise Royals, PA-C  Multiple Vitamins-Minerals (MULTIVITAMINS THER. W/MINERALS) TABS Take 1 tablet by mouth daily.    [provider]  naproxen (NAPROSYN) 500 MG tablet Take 1 tablet (500 mg total) by mouth 2 (two) times daily. 09/24/15   Donnetta Hutching, MD  ondansetron (ZOFRAN) 4 MG tablet Take 1 tablet (4 mg total) by mouth every 6 (six) hours. 09/18/15   Hedges, Tinnie Gens, PA-C  Phenyleph-Promethazine-Cod 5-6.25-10 MG/5ML SYRP Take 5 mLs by mouth 4 (four) times daily. 10/31/15   Felicie Morn, NP    Allergies Patient has no known allergies.  Family History  Problem Relation Age of Onset  . Lupus Mother   . Depression Mother   . Depression Father   . Alcohol abuse Father   . Hypertension Father   . Lupus Maternal Grandmother   . Diabetes Maternal Grandmother   . Hypertension Maternal Grandmother   . Hyperlipidemia Maternal Grandmother   . Diabetes Maternal Grandfather   . Hypertension Maternal Grandfather   . Hyperlipidemia Maternal Grandfather   . Diabetes Paternal Grandmother   . Hypertension Paternal Grandmother   . Hyperlipidemia Paternal Grandmother   . Diabetes Paternal Grandfather   . Hypertension Paternal Grandfather   . Hyperlipidemia Paternal Grandfather     Social History Social History  Substance Use Topics  . Smoking status: Passive Smoke Exposure -  Never Smoker  . Smokeless tobacco: Not on file  . Alcohol use No     Review of Systems  Constitutional: No fever/chills Eyes: No visual changes.  Cardiovascular: no chest pain. Respiratory: no cough. No SOB. Gastrointestinal: No abdominal pain.  No nausea, no vomiting.   Musculoskeletal: positive for neck pain with mild radicular symptoms bilateral upper extremities. Skin: Negative for rash, abrasions, lacerations, ecchymosis. Neurological: positive for  mild global headache but denies focal weakness or numbness. 10-point ROS otherwise negative.  ____________________________________________   PHYSICAL EXAM:  VITAL SIGNS: ED Triage Vitals  Enc Vitals Group     BP 07/02/17 1813 126/72     Pulse Rate 07/02/17 1808 100     Resp 07/02/17 1808 16     Temp 07/02/17 1808 98.5 F (36.9 C)     Temp Source 07/02/17 1808 Oral     SpO2 07/02/17 1808 97 %     Weight 07/02/17 1808 227 lb (103 kg)     Height 07/02/17 1808  (1.803 m)     Head Circumference --      Peak Flow --      Pain Score --      Pain Loc --      Pain Edu? --      Excl. in GC? --      Constitutional: Alert and oriented. Well appearing and in no acute distress. Eyes: Conjunctivae are normal. PERRL. EOMI. Head: Atraumatic. ENT:      Ears:       Nose: No congestion/rhinnorhea.      Mouth/Throat: Mucous membranes are moist.  Neck: No stridor. c-collar in place. This is left in place until imaging is performed.  Cardiovascular: Normal rate, regular rhythm. Normal S1 and S2.  Good peripheral circulation. Respiratory: Normal respiratory effort without tachypnea or retractions. Lungs CTAB. Good air entry to the bases with no decreased or absent breath sounds. Musculoskeletal: Full range of motion to all extremities. No gross deformities appreciated. Neurologic:  Normal speech and language. No gross focal neurologic deficits are appreciated. Cranial nerves II through XII grossly intact. Negative pronator drift. Negative Romberg's. Sensation intact all 4 extremities with no deficits. Radial pulse intact all 4 extremities. Skin:  Skin is warm, dry and intact. No rash noted. Psychiatric: Mood and affect are normal. Speech and behavior are normal. Patient exhibits appropriate insight and judgement.   ____________________________________________   LABS (all labs ordered are listed, but only abnormal results are displayed)  Labs Reviewed - No data to  display ____________________________________________  EKG   ____________________________________________  RADIOLOGY Festus Barren Leyanna Bittman, personally viewed and evaluated these images (plain radiographs) as part of my medical decision making, as well as reviewing the written report by the radiologist.  Ct Head Wo Contrast  Result Date: 07/02/2017 CLINICAL DATA:  Wrestling injury with neck pain. EXAM: CT HEAD WITHOUT CONTRAST CT CERVICAL SPINE WITHOUT CONTRAST TECHNIQUE: Multidetector CT imaging of the head and cervical spine was performed following the standard protocol without intravenous contrast. Multiplanar CT image reconstructions of the cervical spine were also generated. COMPARISON:  Head CT 07/21/2013, CT cervical spine report from 07/21/2013. FINDINGS: CT HEAD FINDINGS BRAIN: The ventricles and sulci are normal. No intraparenchymal hemorrhage, mass effect nor midline shift. No acute large vascular territory infarcts. No abnormal extra-axial fluid collections. Basal cisterns are midline and not effaced. No acute cerebellar abnormality. VASCULAR: No hyperdense vessel or unexpected calcification. SKULL/SOFT TISSUES: No skull fracture. No significant soft tissue swelling. ORBITS/SINUSES: The included  ocular globes and orbital contents are normal.The mastoid air-cells and included paranasal sinuses are well-aerated. OTHER: None. CT CERVICAL SPINE FINDINGS ALIGNMENT: Vertebral bodies in alignment. Straightening of cervical lordosis which may be due to muscle spasm and/or positioning. SKULL BASE AND VERTEBRAE: Cervical vertebral bodies and posterior elements are intact. Intervertebral disc heights preserved. No destructive bony lesions. C1-2 articulation maintained. No perched or jumped facets. SOFT TISSUES AND SPINAL CANAL: Normal. DISC LEVELS: No significant osseous canal stenosis or neural foraminal narrowing. No focal disc herniations. UPPER CHEST: Lung apices are clear. OTHER: None. IMPRESSION:  1. No acute intracranial abnormality. 2. Straightening of cervical lordosis may be due to muscle spasm or positioning. 3. No acute posttraumatic cervical spine fracture or subluxations. Electronically Signed   By: Tollie Eth M.D.   On: 07/02/2017 19:48   Ct Cervical Spine Wo Contrast  Result Date: 07/02/2017 CLINICAL DATA:  Wrestling injury with neck pain. EXAM: CT HEAD WITHOUT CONTRAST CT CERVICAL SPINE WITHOUT CONTRAST TECHNIQUE: Multidetector CT imaging of the head and cervical spine was performed following the standard protocol without intravenous contrast. Multiplanar CT image reconstructions of the cervical spine were also generated. COMPARISON:  Head CT 07/21/2013, CT cervical spine report from 07/21/2013. FINDINGS: CT HEAD FINDINGS BRAIN: The ventricles and sulci are normal. No intraparenchymal hemorrhage, mass effect nor midline shift. No acute large vascular territory infarcts. No abnormal extra-axial fluid collections. Basal cisterns are midline and not effaced. No acute cerebellar abnormality. VASCULAR: No hyperdense vessel or unexpected calcification. SKULL/SOFT TISSUES: No skull fracture. No significant soft tissue swelling. ORBITS/SINUSES: The included ocular globes and orbital contents are normal.The mastoid air-cells and included paranasal sinuses are well-aerated. OTHER: None. CT CERVICAL SPINE FINDINGS ALIGNMENT: Vertebral bodies in alignment. Straightening of cervical lordosis which may be due to muscle spasm and/or positioning. SKULL BASE AND VERTEBRAE: Cervical vertebral bodies and posterior elements are intact. Intervertebral disc heights preserved. No destructive bony lesions. C1-2 articulation maintained. No perched or jumped facets. SOFT TISSUES AND SPINAL CANAL: Normal. DISC LEVELS: No significant osseous canal stenosis or neural foraminal narrowing. No focal disc herniations. UPPER CHEST: Lung apices are clear. OTHER: None. IMPRESSION: 1. No acute intracranial abnormality. 2.  Straightening of cervical lordosis may be due to muscle spasm or positioning. 3. No acute posttraumatic cervical spine fracture or subluxations. Electronically Signed   By: Tollie Eth M.D.   On: 07/02/2017 19:48    ____________________________________________    PROCEDURES  Procedure(s) performed:    Procedures    Medications  orphenadrine (NORFLEX) injection 60 mg (not administered)  ketorolac (TORADOL) injection 60 mg (not administered)     ____________________________________________   INITIAL IMPRESSION / ASSESSMENT AND PLAN / ED COURSE  Pertinent labs & imaging results that were available during my care of the patient were reviewed by me and considered in my medical decision making (see chart for details).  Review of the [place] CSRS was performed in accordance of the [facility] prior to dispensing any controlled drugs.     Patient's diagnosis is consistent with cervical strain. Patient presented to the emergency department complaining of mild headache and neck pain after wrestling injury. Patient was evaluated with CT scan, this returns with no acute osseous abnormality. Exam was reassuring with patient being neurovascularly intact.Examination after removal of c-collar was revealing for muscle spasms bilaterally, worse on the right than left. Patient was given an injection of Toradol and Norflex in the emergency department. Patient will be discharged home with prescriptions for anti-inflammatory and muscle relaxer for symptom  control. Patient is to follow up with primary care as needed or otherwise directed. Patient is given ED precautions to return to the ED for any worsening or new symptoms.     ____________________________________________  FINAL CLINICAL IMPRESSION(S) / ED DIAGNOSES  Final diagnoses:  Strain of neck muscle, initial encounter      NEW MEDICATIONS STARTED DURING THIS VISIT:  New Prescriptions   MELOXICAM (MOBIC) 15 MG TABLET    Take 1 tablet (15 mg  total) by mouth daily.   METHOCARBAMOL (ROBAXIN) 500 MG TABLET    Take 1 tablet (500 mg total) by mouth 4 (four) times daily.        This chart was dictated using voice recognition software/Dragon. Despite best efforts to proofread, errors can occur which can change the meaning. Any change was purely unintentional.    Racheal Patches, PA-C 07/02/17 2035    Phineas Semen, MD 07/02/17 [phone]

## 2017-07-02 NOTE — ED Notes (Signed)
C-collar placed on pt.

## 2017-07-05 ENCOUNTER — Encounter (HOSPITAL_COMMUNITY): Payer: Self-pay | Admitting: Emergency Medicine

## 2017-07-05 ENCOUNTER — Emergency Department (HOSPITAL_COMMUNITY)
Admission: EM | Admit: 2017-07-05 | Discharge: 2017-07-07 | Disposition: A | Discharge status: Other Acute Inpatient Hospital | Payer: No Typology Code available for payment source | Attending: Emergency Medicine | Admitting: Emergency Medicine

## 2017-07-05 DIAGNOSIS — Z7722 Contact with and (suspected) exposure to environmental tobacco smoke (acute) (chronic): Secondary | ICD-10-CM | POA: Insufficient documentation

## 2017-07-05 DIAGNOSIS — Z046 Encounter for general psychiatric examination, requested by authority: Secondary | ICD-10-CM | POA: Insufficient documentation

## 2017-07-05 DIAGNOSIS — R45851 Suicidal ideations: Secondary | ICD-10-CM

## 2017-07-05 DIAGNOSIS — F329 Major depressive disorder, single episode, unspecified: Secondary | ICD-10-CM | POA: Insufficient documentation

## 2017-07-05 LAB — COMPREHENSIVE METABOLIC PANEL
ALT: 43 U/L (ref 17–63)
ANION GAP: 12 (ref 5–15)
AST: 38 U/L (ref 15–41)
Albumin: 4.3 g/dL (ref 3.5–5.0)
Alkaline Phosphatase: 54 U/L (ref 38–126)
BILIRUBIN TOTAL: 0.6 mg/dL (ref 0.3–1.2)
BUN: 5 mg/dL — AB (ref 6–20)
CO2: 24 mmol/L (ref 22–32)
Calcium: 9 mg/dL (ref 8.9–10.3)
Chloride: 106 mmol/L (ref 101–111)
Creatinine, Ser: 0.96 mg/dL (ref 0.61–1.24)
Glucose, Bld: 92 mg/dL (ref 65–99)
POTASSIUM: 3.4 mmol/L — AB (ref 3.5–5.1)
Sodium: 142 mmol/L (ref 135–145)
TOTAL PROTEIN: 7.5 g/dL (ref 6.5–8.1)

## 2017-07-05 LAB — RAPID URINE DRUG SCREEN, HOSP PERFORMED
AMPHETAMINES: NOT DETECTED
BENZODIAZEPINES: NOT DETECTED
Barbiturates: NOT DETECTED
COCAINE: NOT DETECTED
OPIATES: NOT DETECTED
TETRAHYDROCANNABINOL: POSITIVE — AB

## 2017-07-05 LAB — CBC
HCT: 41 % (ref 39.0–52.0)
Hemoglobin: 13.7 g/dL (ref 13.0–17.0)
MCH: 29.1 pg (ref 26.0–34.0)
MCHC: 33.4 g/dL (ref 30.0–36.0)
MCV: 87 fL (ref 78.0–100.0)
PLATELETS: 272 10*3/uL (ref 150–400)
RBC: 4.71 MIL/uL (ref 4.22–5.81)
RDW: 12.9 % (ref 11.5–15.5)
WBC: 8.3 10*3/uL (ref 4.0–10.5)

## 2017-07-05 LAB — ETHANOL: ALCOHOL ETHYL (B): 18 mg/dL — AB (ref <10)

## 2017-07-05 LAB — ACETAMINOPHEN LEVEL: Acetaminophen (Tylenol), Serum: <10 ug/mL — ABNORMAL LOW (ref 10–30)

## 2017-07-05 LAB — SALICYLATE LEVEL: Salicylate Lvl: <7 mg/dL (ref 2.8–30.0)

## 2017-07-05 MED ORDER — LORAZEPAM 1 MG PO TABS
1.0000 mg | ORAL_TABLET | Freq: Three times a day (TID) | ORAL | Status: DC | PRN
  Administered 2017-07-05 – 2017-07-07 (×3): 1 mg via ORAL
  Filled 2017-07-05 (×3): qty 1

## 2017-07-05 NOTE — ED Notes (Signed)
Pt expressed great concern about a requirement to be in court in the morning. RN to discuss with EDP

## 2017-07-05 NOTE — ED Triage Notes (Addendum)
Pt states he has been depressed and having feeling of hurting himself, today he took 7 or 8  of Robaxin.  Pt states he didn't think it would do anything to bad, pt is alert and ox4.

## 2017-07-05 NOTE — Progress Notes (Signed)
CSW reviewed pt chart. Per Assunta Found, NP, pt meets criteria for inpatient hospitalization.  Pt referral packet sent to the following hospitals: [facility], [facility], [facility], [facility], [facility]  Disposition:  CSW will continue to follow for placement.

## 2017-07-05 NOTE — ED Notes (Signed)
ED Provider at bedside. 

## 2017-07-05 NOTE — ED Notes (Signed)
Poison control called. Tylenol level, aspirin level, and EKG.

## 2017-07-05 NOTE — ED Notes (Signed)
Family at bedside. Family and pt briefed on visiting hours for future visitations. Pt smiling and interacting. Family is seemingly supportive.

## 2017-07-05 NOTE — ED Provider Notes (Signed)
Emergency Department Provider Note   I have reviewed the triage vital signs and the nursing notes.   HISTORY  Chief Complaint Suicidal   HPI Vincent Valenzuela is a 21 y.o. male history of depression and anxiety not on medications for a couple months presents emergency department today with worsening depression and increasing episodes of suicidal thoughts. Patient to 8 500 mg Robaxin today not necessary to hurt himself but he didn't care if he did die. States these thoughts and become more prominent and invasive recently. No homicidal thoughts, hallucinations, delusions or other psychosis. No recent illnesses. No other coingestions. No other associated modifying symptoms. Has had suicide attempts in the past by [place] traffic and cutting his wrists.   Past Medical History:  Diagnosis Date  . Allergy   . Anxiety   . GERD (gastroesophageal reflux disease)   . Headaches, cluster     Patient Active Problem List   Diagnosis Date Noted  . Strain of left trapezius muscle 06/26/2015  . Rotator cuff syndrome 03/12/2014  . Back pain 10/04/2013  . Social anxiety disorder 06/14/2013    History reviewed. No pertinent surgical history.  Current Outpatient Rx  . Order #: [id number] Class: Historical Med  . Order #: [id number] Class: Historical Med  . Order #: [id number] Class: Print  . Order #: [id number] Class: Historical Med    Allergies Patient has no known allergies.  Family History  Problem Relation Age of Onset  . Lupus Mother   . Depression Mother   . Depression Father   . Alcohol abuse Father   . Hypertension Father   . Lupus Maternal Grandmother   . Diabetes Maternal Grandmother   . Hypertension Maternal Grandmother   . Hyperlipidemia Maternal Grandmother   . Diabetes Maternal Grandfather   . Hypertension Maternal Grandfather   . Hyperlipidemia Maternal Grandfather   . Diabetes Paternal Grandmother   . Hypertension Paternal Grandmother   . Hyperlipidemia Paternal Grandmother    . Diabetes Paternal Grandfather   . Hypertension Paternal Grandfather   . Hyperlipidemia Paternal Grandfather     Social History Social History  Substance Use Topics  . Smoking status: Passive Smoke Exposure - Never Smoker  . Smokeless tobacco: Not on file  . Alcohol use No    Review of Systems  All other systems negative except as documented in the HPI. All pertinent positives and negatives as reviewed in the HPI. ____________________________________________   PHYSICAL EXAM:  VITAL SIGNS: ED Triage Vitals [07/05/17 1358]  Enc Vitals Group     BP (!) 141/87     Pulse Rate 87     Resp 16     Temp 98 F (36.7 C)     Temp Source Oral     SpO2 97 %     Weight      Height      Head Circumference      Peak Flow      Pain Score 0     Pain Loc      Pain Edu?      Excl. in GC?    Constitutional: Alert and oriented. Well appearing and in no acute distress. Eyes: Conjunctivae are normal. PERRL. EOMI. Head: Atraumatic. Nose: No congestion/rhinnorhea. Mouth/Throat: Mucous membranes are moist.  Oropharynx non-erythematous. Neck: No stridor.  No meningeal signs.   Cardiovascular: Normal rate, regular rhythm. Good peripheral circulation. Grossly normal heart sounds.   Respiratory: Normal respiratory effort.  No retractions. Lungs CTAB. Gastrointestinal: Soft and nontender. No distention.  Musculoskeletal: No  lower extremity tenderness nor edema. No gross deformities of extremities. Neurologic:  Normal speech and language. No gross focal neurologic deficits are appreciated.  Skin:  Skin is warm, dry and intact. No rash noted.   ____________________________________________   LABS (all labs ordered are listed, but only abnormal results are displayed)  Labs Reviewed  COMPREHENSIVE METABOLIC PANEL - Abnormal; Notable for the following:       Result Value   Potassium 3.4 (*)    BUN 5 (*)    All other components within normal limits  ETHANOL - Abnormal; Notable for the  following:    Alcohol, Ethyl (B) 18 (*)    All other components within normal limits  ACETAMINOPHEN LEVEL - Abnormal; Notable for the following:    Acetaminophen (Tylenol), Serum <10 (*)    All other components within normal limits  RAPID URINE DRUG SCREEN, HOSP PERFORMED - Abnormal; Notable for the following:    Tetrahydrocannabinol POSITIVE (*)    All other components within normal limits  SALICYLATE LEVEL  CBC   ____________________________________________  EKG   EKG Interpretation  Date/Time:    Ventricular Rate:    PR Interval:    QRS Duration:   QT Interval:    QTC Calculation:   R Axis:     Text Interpretation:         ____________________________________________  RADIOLOGY  No results found.  ____________________________________________   PROCEDURES  Procedure(s) performed:   Procedures   ____________________________________________   INITIAL IMPRESSION / ASSESSMENT AND PLAN / ED COURSE  Pertinent labs & imaging results that were available during my care of the patient were reviewed by me and considered in my medical decision making (see chart for details).  Worsening suicidal thoughts and depression so we'll consult TTS. Patient is able to talk to TTS at this point however is not medically cleared until 4 hours after arrival.  Patient is medically cleared for TTS recommendations.   TTS recommends inpatient. Patient agreeable to staying. No IVC.   ____________________________________________  FINAL CLINICAL IMPRESSION(S) / ED DIAGNOSES  Final diagnoses:  Suicidal ideation     MEDICATIONS GIVEN DURING THIS VISIT:  Medications  LORazepam (ATIVAN) tablet 1 mg (1 mg Oral Given 07/05/17 2318)     NEW OUTPATIENT MEDICATIONS STARTED DURING THIS VISIT:  Current Discharge Medication List      Note:  This document was prepared using Dragon voice recognition software and may include unintentional dictation errors.   Daris Harkins, Barbara Cower,  MD 07/06/17 [phone]

## 2017-07-05 NOTE — ED Notes (Addendum)
Pt placing phone call. Pt is calm and collected but continuously moving around while talking.

## 2017-07-06 MED ORDER — LORAZEPAM 1 MG PO TABS
1.0000 mg | ORAL_TABLET | Freq: Once | ORAL | Status: AC
  Administered 2017-07-06: 1 mg via ORAL
  Filled 2017-07-06: qty 1

## 2017-07-06 NOTE — ED Notes (Addendum)
Vincent Valenzuela   MOM [phone]  Cell [phone]  Home  Dorie Rank   Girlfriend [phone]  Cell [phone]  Home

## 2017-07-06 NOTE — ED Notes (Signed)
Family visiting with Pt.

## 2017-07-06 NOTE — ED Notes (Signed)
Pt calmly requested his PRN Anxiety meds. States he is starting to feel anxious.

## 2017-07-06 NOTE — ED Notes (Signed)
Regular Diet was ordered for patient for Dinner.

## 2017-07-06 NOTE — Progress Notes (Signed)
Patient has been accepted to [facility] for inpatient treatment. Still waiting on an actual bed assignment.   [facility] requesting that the patient sign a voluntary consent form, and for [facility] staff to fax it to [facility] at [phone].   Blaine Hamper, RN notified.    Baldo Daub MSW, LCSWA CSW Disposition [phone]

## 2017-07-06 NOTE — Progress Notes (Signed)
CSW consulted for documentation of court this morning. CSW spoke with pt and provided pt with a letter that pt can fax to lawyer or to courts. At this time there are no further CSW needs from this CSW. This CSW signing off. [facility]  CSW still involved at this time.   Claude Manges Mesha Schamberger, MSW, LCSW-A Emergency Department Clinical Social Worker [phone]

## 2017-07-06 NOTE — ED Notes (Signed)
Girlfriend of Pt is visiting.

## 2017-07-06 NOTE — Progress Notes (Signed)
Per Jerilynn Som, the patient has been accepted to [facility] for inpatient treatment. Accepting physician is Dr. Alyse Low; Attendingphysician will be Dr. Jennet Maduro.   Patient has been assigned to room 322, by [facility] Charge Nurse [place]. Call report to [phone].   Blaine Hamper, RN notified.     Baldo Daub MSW, LCSWA CSW Disposition [phone]

## 2017-07-06 NOTE — Care Management (Signed)
ED CM noted that patient has a bed at [facility]  Transport is unable to transport due to weather. Patient will be transported tomorrow.

## 2017-07-06 NOTE — ED Notes (Signed)
Pt verbalized understanding discharge instructions and denies any further needs or questions at this time. VS stable, ambulatory and steady gait. Pt leaving with [facility] to [facility] Rm 322

## 2017-07-06 NOTE — ED Notes (Signed)
[facility] was called.  Message was left for call back.

## 2017-07-06 NOTE — ED Notes (Signed)
Mother concerned about patient still being in the Ed, she wants to take him home and states she had taken off work on Fri. And was going to take him to [facility]. I call and spoke with Baxter Hire at [facility] assessment she stated patient would be getting a bed today

## 2017-07-06 NOTE — ED Notes (Signed)
[facility] is unable to transport today due to weather.  [facility] is also unable to transport.  Charge made aware. Will inform [facility] BH Pt will not be there until tomorrow.

## 2017-07-06 NOTE — ED Notes (Addendum)
Pt has been accepted at [facility]. Waiting on a bed to be assigned.  Pt needs to sign voluntary consent and have it faxed to [phone].  Fax sent 

## 2017-07-06 NOTE — BH Assessment (Signed)
Writer forwarded patient's information to [facility] BMU (Dr. Jennet Maduro) and [facility] Charge Nurse Jamesetta So) , per instructions of [facility] Medical Director (Dr. Alyse Low) during the "[facility] [place] Call."

## 2017-07-06 NOTE — BH Assessment (Signed)
Patient has been accepted to [facility].  Accepting physician is Dr. Alyse Low.  Attending Physician will be Dr. Jennet Maduro.  Patient has been assigned to room 322, by [facility] Charge Nurse [place].  Call report to [phone].  Representative/Transfer Coordinator is Warden/ranger Patient pre-admitted by [facility] Patient Access Ivin Booty)  [facility] Staff [facility], TTS) made aware of acceptance.

## 2017-07-07 ENCOUNTER — Encounter: Payer: Self-pay | Admitting: *Deleted

## 2017-07-07 ENCOUNTER — Inpatient Hospital Stay
Admission: AD | Admit: 2017-07-07 | Discharge: 2017-07-07 | DRG: 885 | Disposition: A | Discharge status: Home / Self Care | Payer: No Typology Code available for payment source | Source: Intra-hospital | Attending: Psychiatry | Admitting: Psychiatry

## 2017-07-07 DIAGNOSIS — Z8249 Family history of ischemic heart disease and other diseases of the circulatory system: Secondary | ICD-10-CM | POA: Diagnosis not present

## 2017-07-07 DIAGNOSIS — Z7722 Contact with and (suspected) exposure to environmental tobacco smoke (acute) (chronic): Secondary | ICD-10-CM | POA: Diagnosis present

## 2017-07-07 DIAGNOSIS — Z833 Family history of diabetes mellitus: Secondary | ICD-10-CM

## 2017-07-07 DIAGNOSIS — Z818 Family history of other mental and behavioral disorders: Secondary | ICD-10-CM

## 2017-07-07 DIAGNOSIS — F332 Major depressive disorder, recurrent severe without psychotic features: Principal | ICD-10-CM | POA: Diagnosis present

## 2017-07-07 DIAGNOSIS — F1220 Cannabis dependence, uncomplicated: Secondary | ICD-10-CM | POA: Diagnosis present

## 2017-07-07 DIAGNOSIS — F4010 Social phobia, unspecified: Secondary | ICD-10-CM | POA: Diagnosis present

## 2017-07-07 DIAGNOSIS — F4310 Post-traumatic stress disorder, unspecified: Secondary | ICD-10-CM | POA: Diagnosis present

## 2017-07-07 DIAGNOSIS — F429 Obsessive-compulsive disorder, unspecified: Secondary | ICD-10-CM | POA: Diagnosis present

## 2017-07-07 DIAGNOSIS — T50901A Poisoning by unspecified drugs, medicaments and biological substances, accidental (unintentional), initial encounter: Secondary | ICD-10-CM | POA: Diagnosis present

## 2017-07-07 DIAGNOSIS — F1210 Cannabis abuse, uncomplicated: Secondary | ICD-10-CM | POA: Diagnosis present

## 2017-07-07 DIAGNOSIS — Z79899 Other long term (current) drug therapy: Secondary | ICD-10-CM | POA: Diagnosis not present

## 2017-07-07 DIAGNOSIS — Z811 Family history of alcohol abuse and dependence: Secondary | ICD-10-CM | POA: Diagnosis not present

## 2017-07-07 DIAGNOSIS — T481X1A Poisoning by skeletal muscle relaxants [neuromuscular blocking agents], accidental (unintentional), initial encounter: Secondary | ICD-10-CM | POA: Diagnosis present

## 2017-07-07 DIAGNOSIS — F410 Panic disorder [episodic paroxysmal anxiety] without agoraphobia: Secondary | ICD-10-CM | POA: Diagnosis present

## 2017-07-07 MED ORDER — ACETAMINOPHEN 325 MG PO TABS: 650.0000 mg | ORAL_TABLET | Freq: Four times a day (QID) | ORAL | Status: DC | PRN

## 2017-07-07 MED ORDER — TRAZODONE HCL 100 MG PO TABS: 100.0000 mg | ORAL_TABLET | Freq: Every evening | ORAL | Status: DC | PRN

## 2017-07-07 MED ORDER — ALUM & MAG HYDROXIDE-SIMETH 200-200-20 MG/5ML PO SUSP: 30.0000 mL | ORAL | Status: DC | PRN

## 2017-07-07 MED ORDER — FLUVOXAMINE MALEATE 50 MG PO TABS: 50.0000 mg | ORAL_TABLET | Freq: Every day | ORAL | Status: DC

## 2017-07-07 MED ORDER — QUETIAPINE FUMARATE 25 MG PO TABS: 50.0000 mg | ORAL_TABLET | Freq: Every day | ORAL | Status: DC

## 2017-07-07 MED ORDER — ETODOLAC 200 MG PO CAPS
200.0000 mg | ORAL_CAPSULE | Freq: Two times a day (BID) | ORAL | Status: DC
  Administered 2017-07-07: 200 mg via ORAL
  Filled 2017-07-07: qty 1

## 2017-07-07 MED ORDER — FLUVOXAMINE MALEATE 50 MG PO TABS: 100.0000 mg | ORAL_TABLET | Freq: Every day | ORAL | Status: DC

## 2017-07-07 MED ORDER — MAGNESIUM HYDROXIDE 400 MG/5ML PO SUSP: 30.0000 mL | Freq: Every day | ORAL | Status: DC | PRN

## 2017-07-07 MED ORDER — TRAZODONE HCL 100 MG PO TABS: 100.0000 mg | ORAL_TABLET | Freq: Every day | ORAL | Status: DC

## 2017-07-07 MED ORDER — FLUVOXAMINE MALEATE 100 MG PO TABS: 100.0000 mg | ORAL_TABLET | Freq: Every day | ORAL | 1 refills | Status: DC

## 2017-07-07 NOTE — Progress Notes (Signed)
Patient is requesting discharge. He states he talked to the MD and that the possibility was discussed.  Explained to patient that he may be here through the weekend, however, he can always talk to the doctor that is here over the weekend.
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
## 2017-07-07 NOTE — [facility] Suicide Risk Assessment (Signed)
[facility] Admission Suicide Risk Assessment   Nursing information obtained from:  Patient Demographic factors:  Male, Low socioeconomic status, Unemployed Current Mental Status:  NA Loss Factors:  Decrease in vocational status, Financial problems / change in socioeconomic status Historical Factors:  Impulsivity Risk Reduction Factors:  Sense of responsibility to family  Total Time spent with patient: 1 hour Principal Problem: <principal problem not specified> Diagnosis:   Patient Active Problem List   Diagnosis Date Noted  . Cannabis use disorder, moderate, dependence (HCC) [F12.20] 07/07/2017  . Major depressive disorder, recurrent severe without psychotic features (HCC) [F33.2] 07/07/2017  . Overdose [T50.901A] 07/07/2017  . PTSD (post-traumatic stress disorder) [F43.10] 07/07/2017  . OCD (obsessive compulsive disorder) [F42.9] 07/07/2017  . Strain of left trapezius muscle [S46.812A] 06/26/2015  . Rotator cuff syndrome [M75.100] 03/12/2014  . Back pain [M54.9] 10/04/2013  . Social anxiety disorder [F40.10] 06/14/2013   Subjective Data: overdose.  Continued Clinical Symptoms:  Alcohol Use Disorder Identification Test Final Score (AUDIT): 4 The "Alcohol Use Disorders Identification Test", Guidelines for Use in Primary Care, Second Edition.  World Science writer [facility]). Score between 0-7:  no or low risk or alcohol related problems. Score between 8-15:  moderate risk of alcohol related problems. Score between 16-19:  high risk of alcohol related problems. Score 20 or above:  warrants further diagnostic evaluation for alcohol dependence and treatment.   CLINICAL FACTORS:   Severe Anxiety and/or Agitation Depression:   Impulsivity Obsessive-Compulsive Disorder   Musculoskeletal: Strength & Muscle Tone: within normal limits Gait & Station: normal Patient leans: N/A  Psychiatric Specialty Exam: Physical Exam  Nursing note and vitals reviewed. Psychiatric: His speech is  normal. Thought content normal. His mood appears anxious. He is hyperactive. Cognition and memory are normal. He expresses impulsivity.    Review of Systems  Musculoskeletal: Positive for neck pain.  Psychiatric/Behavioral: Positive for depression. The patient is nervous/anxious.   All other systems reviewed and are negative.   Blood pressure (!) 146/77, pulse 97, temperature 98.7 F (37.1 C), temperature source Oral, resp. rate 18, height 5' 11.5" (1.816 m), weight 103 kg (227 lb), SpO2 99 %.Body mass index is 31.22 kg/m.  General Appearance: Casual  Eye Contact:  Good  Speech:  Clear and Coherent  Volume:  Normal  Mood:  Anxious  Affect:  Congruent  Thought Process:  Goal Directed and Descriptions of Associations: Intact  Orientation:  Full (Time, Place, and Person)  Thought Content:  WDL  Suicidal Thoughts:  No  Homicidal Thoughts:  No  Memory:  Immediate;   Fair Recent;   Fair Remote;   Fair  Judgement:  Impaired  Insight:  Shallow  Psychomotor Activity:  Increased and Restlessness  Concentration:  Concentration: Fair and Attention Span: Fair  Recall:  [employer] of Knowledge:  Fair  Language:  Fair  Akathisia:  No  Handed:  Right  AIMS (if indicated):     Assets:  Communication Skills Desire for Improvement Financial Resources/Insurance Housing Intimacy Physical Health Resilience Social Support Talents/Skills Transportation Vocational/Educational  ADL's:  Intact  Cognition:  WNL  Sleep:         COGNITIVE FEATURES THAT CONTRIBUTE TO RISK:  None    SUICIDE RISK:   Mild:  Suicidal ideation of limited frequency, intensity, duration, and specificity.  There are no identifiable plans, no associated intent, mild dysphoria and related symptoms, good self-control (both objective and subjective assessment), few other risk factors, and identifiable protective factors, including available and accessible social support.  PLAN OF CARE: See admission, medication  management, discharge planning.  Mr. Bua is a 21 year old male with history of severe anxiety transferred from [facility] emergency room after overdose on Robaxin in the context of relationship problems.  1. Suicidal ideation. The patient adamantly denies any thoughts, intention, or plans to hurt himself or others. He is able to contract for safety in the hospital.  2. Mood and anxiety. He reports severe social anxiety, nightmares and flashbacks of PTSD, and OCD type symptoms. We will start Luvox 50 mg tonight. Please increase the dose to 100 if tolerated. We will start Seroquel 50 mg tonight.   3. Neck pain. We will give Lodine.  4. Metabolic syndrome monitoring. Lipid panel, TSH, hemoglobin A1c are pending.  5. EKG. Pending.  6. Disposition. He will be discharged to home with family. He will follow up with [facility].   I certify that inpatient services furnished can reasonably be expected to improve the patient's condition.   Kristine Linea, MD 07/07/2017, 4:53 PM

## 2017-07-07 NOTE — ED Notes (Signed)
Pt gives permission to update the pts mother on plan of care, pts mother requesting pt receive Ativan d/t being in hallway bed as he is anxious, Ativan will be given per PRN order, pt aware of plan, pt cooperative at this time

## 2017-07-07 NOTE — [facility] Suicide Risk Assessment (Signed)
[facility] Discharge Suicide Risk Assessment   Principal Problem: Major depressive disorder, recurrent severe without psychotic features [facility]) Discharge Diagnoses:  Patient Active Problem List   Diagnosis Date Noted  . Cannabis use disorder, moderate, dependence (HCC) [F12.20] 07/07/2017  . Major depressive disorder, recurrent severe without psychotic features (HCC) [F33.2] 07/07/2017  . Overdose [T50.901A] 07/07/2017  . PTSD (post-traumatic stress disorder) [F43.10] 07/07/2017  . OCD (obsessive compulsive disorder) [F42.9] 07/07/2017  . Strain of left trapezius muscle [S46.812A] 06/26/2015  . Rotator cuff syndrome [M75.100] 03/12/2014  . Back pain [M54.9] 10/04/2013  . Social anxiety disorder [F40.10] 06/14/2013    Total Time spent with patient: 1 hour  Musculoskeletal: Strength & Muscle Tone: within normal limits Gait & Station: normal Patient leans: N/A  Psychiatric Specialty Exam: Review of Systems  Neurological: Negative.   Psychiatric/Behavioral: Negative.   All other systems reviewed and are negative.   Blood pressure (!) 146/77, pulse 97, temperature 98.7 F (37.1 C), temperature source Oral, resp. rate 18, height 5' 11.5" (1.816 m), weight 103 kg (227 lb), SpO2 99 %.Body mass index is 31.22 kg/m.  General Appearance: Casual  Eye Contact::  Good  Speech:  Clear and Coherent409  Volume:  Normal  Mood:  Euthymic  Affect:  Appropriate  Thought Process:  Goal Directed and Descriptions of Associations: Intact  Orientation:  Full (Time, Place, and Person)  Thought Content:  WDL  Suicidal Thoughts:  No  Homicidal Thoughts:  No  Memory:  Immediate;   Fair Recent;   Fair Remote;   Fair  Judgement:  Impaired  Insight:  Shallow  Psychomotor Activity:  Normal  Concentration:  Fair  Recall:  [employer] of Knowledge:Fair  Language: Fair  Akathisia:  No  Handed:  Right  AIMS (if indicated):     Assets:  Communication Skills Desire for Improvement Financial  Resources/Insurance Housing Intimacy Physical Health Resilience Social Support Vocational/Educational  Sleep:     Cognition: WNL  ADL's:  Intact   Mental Status Per Nursing Assessment::   On Admission:  NA  Demographic Factors:  Male  Loss Factors: NA  Historical Factors: Family history of mental illness or substance abuse and Impulsivity  Risk Reduction Factors:   Sense of responsibility to family, Living with another person, especially a relative and Positive social support  Continued Clinical Symptoms:  Severe Anxiety and/or Agitation Depression:   Comorbid alcohol abuse/dependence Impulsivity Alcohol/Substance Abuse/Dependencies  Cognitive Features That Contribute To Risk:  None    Suicide Risk:  Minimal: No identifiable suicidal ideation.  Patients presenting with no risk factors but with morbid ruminations; may be classified as minimal risk based on the severity of the depressive symptoms    Plan Of Care/Follow-up recommendations:  Activity:  as tolerated. Diet:  regular. Other:  keep follow up appointments.  Kristine Linea, MD 07/07/2017, 7:44 PM

## 2017-07-07 NOTE — ED Provider Notes (Signed)
Patient has been accepted by Dr. Lucianne Muss for transfer to [facility].  Patient evaluated, feeling well currently and agreeable to transfer. Will fill out Jackquline Bosch, Elmarie Shiley, PA-C 07/07/17 1213    Tegeler, Canary Brim, MD 07/07/17 [phone]
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
## 2017-07-07 NOTE — Tx Team (Signed)
Initial Treatment Plan 07/07/2017 2:59 PM [name] WJX:[id number]    PATIENT STRESSORS: Financial difficulties Medication change or noncompliance   PATIENT STRENGTHS: Communication skills Physical Health Supportive family/friends   PATIENT IDENTIFIED PROBLEMS: "I need help with my anxiety; it makes me depressed"  Medication noncompliance  Conflict with significant other  Suicide risk  "I need medication for depression and anxiety"             DISCHARGE CRITERIA:  Improved stabilization in mood, thinking, and/or behavior Reduction of life-threatening or endangering symptoms to within safe limits Verbal commitment to aftercare and medication compliance  PRELIMINARY DISCHARGE PLAN: Outpatient therapy Return to previous living arrangement  PATIENT/FAMILY INVOLVEMENT: This treatment plan has been presented to and reviewed with the patient, Vincent Valenzuela.  The patient and family have been given the opportunity to ask questions and make suggestions.  Cranford Mon, RN 07/07/2017, 2:59 PM

## 2017-07-07 NOTE — Progress Notes (Signed)
Patient was given a copy of the discharge summary and medication information was reviewed.  Patient was given a prescriptions from Dr Jennet Maduro.  Patient has copy of discharge forms and cell phone from locker.  Patient left the unit with his Mother at 2105.

## 2017-07-07 NOTE — ED Notes (Signed)
Pt received breakfast tray 
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
## 2017-08-20 ENCOUNTER — Inpatient Hospital Stay (HOSPITAL_COMMUNITY)
Admission: AD | Admit: 2017-08-20 | Discharge: 2017-08-24 | DRG: 885 | Disposition: A | Discharge status: Home / Self Care | Payer: Federal, State, Local not specified - Other | Source: Intra-hospital | Attending: Psychiatry | Admitting: Psychiatry

## 2017-08-20 ENCOUNTER — Other Ambulatory Visit: Payer: Self-pay

## 2017-08-20 ENCOUNTER — Encounter (HOSPITAL_COMMUNITY): Payer: Self-pay | Admitting: Emergency Medicine

## 2017-08-20 ENCOUNTER — Emergency Department (HOSPITAL_COMMUNITY)
Admission: EM | Admit: 2017-08-20 | Discharge: 2017-08-20 | Disposition: A | Discharge status: Other Acute Inpatient Hospital | Payer: No Typology Code available for payment source | Attending: Emergency Medicine | Admitting: Emergency Medicine

## 2017-08-20 ENCOUNTER — Encounter (HOSPITAL_COMMUNITY): Payer: Self-pay | Admitting: *Deleted

## 2017-08-20 DIAGNOSIS — F918 Other conduct disorders: Secondary | ICD-10-CM | POA: Insufficient documentation

## 2017-08-20 DIAGNOSIS — R45851 Suicidal ideations: Secondary | ICD-10-CM | POA: Insufficient documentation

## 2017-08-20 DIAGNOSIS — F419 Anxiety disorder, unspecified: Secondary | ICD-10-CM | POA: Insufficient documentation

## 2017-08-20 DIAGNOSIS — F332 Major depressive disorder, recurrent severe without psychotic features: Secondary | ICD-10-CM | POA: Insufficient documentation

## 2017-08-20 DIAGNOSIS — Z590 Homelessness: Secondary | ICD-10-CM

## 2017-08-20 DIAGNOSIS — Z79899 Other long term (current) drug therapy: Secondary | ICD-10-CM | POA: Insufficient documentation

## 2017-08-20 DIAGNOSIS — Z7722 Contact with and (suspected) exposure to environmental tobacco smoke (acute) (chronic): Secondary | ICD-10-CM | POA: Insufficient documentation

## 2017-08-20 DIAGNOSIS — Z915 Personal history of self-harm: Secondary | ICD-10-CM

## 2017-08-20 DIAGNOSIS — Z818 Family history of other mental and behavioral disorders: Secondary | ICD-10-CM

## 2017-08-20 DIAGNOSIS — Z560 Unemployment, unspecified: Secondary | ICD-10-CM

## 2017-08-20 DIAGNOSIS — G4700 Insomnia, unspecified: Secondary | ICD-10-CM | POA: Diagnosis present

## 2017-08-20 DIAGNOSIS — Z046 Encounter for general psychiatric examination, requested by authority: Secondary | ICD-10-CM | POA: Diagnosis not present

## 2017-08-20 DIAGNOSIS — F329 Major depressive disorder, single episode, unspecified: Secondary | ICD-10-CM | POA: Diagnosis present

## 2017-08-20 DIAGNOSIS — F1220 Cannabis dependence, uncomplicated: Secondary | ICD-10-CM | POA: Diagnosis present

## 2017-08-20 DIAGNOSIS — F429 Obsessive-compulsive disorder, unspecified: Secondary | ICD-10-CM | POA: Diagnosis present

## 2017-08-20 DIAGNOSIS — Z811 Family history of alcohol abuse and dependence: Secondary | ICD-10-CM

## 2017-08-20 DIAGNOSIS — F4325 Adjustment disorder with mixed disturbance of emotions and conduct: Secondary | ICD-10-CM

## 2017-08-20 DIAGNOSIS — Z7289 Other problems related to lifestyle: Secondary | ICD-10-CM

## 2017-08-20 DIAGNOSIS — F4010 Social phobia, unspecified: Secondary | ICD-10-CM | POA: Diagnosis present

## 2017-08-20 DIAGNOSIS — F4310 Post-traumatic stress disorder, unspecified: Secondary | ICD-10-CM | POA: Diagnosis present

## 2017-08-20 LAB — CBC
HEMATOCRIT: 42.5 % (ref 39.0–52.0)
Hemoglobin: 14.1 g/dL (ref 13.0–17.0)
MCH: 29.2 pg (ref 26.0–34.0)
MCHC: 33.2 g/dL (ref 30.0–36.0)
MCV: 88 fL (ref 78.0–100.0)
Platelets: 283 10*3/uL (ref 150–400)
RBC: 4.83 MIL/uL (ref 4.22–5.81)
RDW: 12.7 % (ref 11.5–15.5)
WBC: 10.6 10*3/uL — ABNORMAL HIGH (ref 4.0–10.5)

## 2017-08-20 LAB — COMPREHENSIVE METABOLIC PANEL
ALBUMIN: 4.4 g/dL (ref 3.5–5.0)
ALK PHOS: 44 U/L (ref 38–126)
ALT: 30 U/L (ref 17–63)
ANION GAP: 6 (ref 5–15)
AST: 28 U/L (ref 15–41)
BILIRUBIN TOTAL: 0.4 mg/dL (ref 0.3–1.2)
BUN: 14 mg/dL (ref 6–20)
CALCIUM: 9.4 mg/dL (ref 8.9–10.3)
CO2: 24 mmol/L (ref 22–32)
Chloride: 109 mmol/L (ref 101–111)
Creatinine, Ser: 0.86 mg/dL (ref 0.61–1.24)
GFR calc Af Amer: >60 mL/min (ref >60)
GFR calc non Af Amer: >60 mL/min (ref >60)
GLUCOSE: 105 mg/dL — AB (ref 65–99)
Potassium: 3.8 mmol/L (ref 3.5–5.1)
Sodium: 139 mmol/L (ref 135–145)
TOTAL PROTEIN: 7.6 g/dL (ref 6.5–8.1)

## 2017-08-20 LAB — RAPID URINE DRUG SCREEN, HOSP PERFORMED
Amphetamines: NOT DETECTED
BARBITURATES: NOT DETECTED
Benzodiazepines: NOT DETECTED
Cocaine: NOT DETECTED
Opiates: NOT DETECTED
Tetrahydrocannabinol: POSITIVE — AB

## 2017-08-20 LAB — ETHANOL: Alcohol, Ethyl (B): <10 mg/dL (ref <10)

## 2017-08-20 LAB — SALICYLATE LEVEL: Salicylate Lvl: <7 mg/dL (ref 2.8–30.0)

## 2017-08-20 LAB — ACETAMINOPHEN LEVEL

## 2017-08-20 MED ORDER — ALUM & MAG HYDROXIDE-SIMETH 200-200-20 MG/5ML PO SUSP: 30.0000 mL | ORAL | Status: DC | PRN

## 2017-08-20 MED ORDER — FLUVOXAMINE MALEATE 50 MG PO TABS: 25.0000 mg | ORAL_TABLET | Freq: Every day | ORAL | Status: DC

## 2017-08-20 MED ORDER — MAGNESIUM HYDROXIDE 400 MG/5ML PO SUSP: 30.0000 mL | Freq: Every day | ORAL | Status: DC | PRN

## 2017-08-20 MED ORDER — ACETAMINOPHEN 325 MG PO TABS
650.0000 mg | ORAL_TABLET | Freq: Four times a day (QID) | ORAL | Status: DC | PRN
  Administered 2017-08-21 – 2017-08-24 (×3): 650 mg via ORAL
  Filled 2017-08-20 (×2): qty 2

## 2017-08-20 MED ORDER — TRAZODONE HCL 50 MG PO TABS
50.0000 mg | ORAL_TABLET | Freq: Once | ORAL | Status: AC
  Administered 2017-08-20: 50 mg via ORAL
  Filled 2017-08-20 (×2): qty 1

## 2017-08-20 MED ORDER — FLUVOXAMINE MALEATE 50 MG PO TABS
25.0000 mg | ORAL_TABLET | Freq: Every day | ORAL | Status: DC
  Administered 2017-08-20: 25 mg via ORAL
  Filled 2017-08-20 (×4): qty 1
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
## 2017-08-20 NOTE — [facility] Suicide Risk Assessment (Signed)
[facility] INPATIENT:  Family/Significant Other Suicide Prevention Education  Suicide Prevention Education:  Patient Refusal for Family/Significant Other Suicide Prevention Education: The patient Vincent Valenzuela has refused to provide written consent for family/significant other to be provided Family/Significant Other Suicide Prevention Education during admission and/or prior to discharge.  Physician notified.  Suicide Prevention Education done with patient, brochure provided, and asked patient to show mother as well.  Carloyn JaegerMareida J Grossman-Orr 08/20/2017, 4:37 PM
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
## 2017-08-20 NOTE — Tx Team (Signed)
Initial Treatment Plan 08/20/2017 3:59 PM Vincent KillKenneth E Cypert [id number]    PATIENT STRESSORS: Legal issue Marital or family conflict Occupational concerns Substance abuse   PATIENT STRENGTHS: Average or above average intelligence Communication skills General fund of knowledge Motivation for treatment/growth Physical Health Supportive family/friends Work skills   PATIENT IDENTIFIED PROBLEMS:   "To get a better mental state."  Suicidal Ideation  "Find out how to deal with depression moving forward."               DISCHARGE CRITERIA:  Improved stabilization in mood, thinking, and/or behavior Need for constant or close observation no longer present Reduction of life-threatening or endangering symptoms to within safe limits Verbal commitment to aftercare and medication compliance  PRELIMINARY DISCHARGE PLAN: Attend aftercare/continuing care group Outpatient therapy Return to previous living arrangement  PATIENT/FAMILY INVOLVEMENT: This treatment plan has been presented to and reviewed with the patient, Vincent Valenzuela, and/or family member.  The patient and family have been given the opportunity to ask questions and make suggestions.  Lawrence MarseillesFriedman, Marian Eakes, RN 08/20/2017, 3:59 PM

## 2017-08-20 NOTE — Progress Notes (Signed)
Patient ID: Vincent Valenzuela, male   DOB: 1995-11-01, 21 y.o.   MRN: [id number]  Pt currently presents with a blunted affect and anxious behavior. Pt reports to Clinical research associatewriter that his better has gotten better [facility]. Pt remains on the phone for most of the night. Pt preoccupied with ex girlfriend and her current behavior. Pt reports poor sleep prior to arrival, requests sleep medication.  Pt provided with medications per providers orders. Pt's labs and vitals were monitored throughout the night. Pt given a 1:1 about emotional and mental status. Pt supported and encouraged to express concerns and questions. Provider notified of patients concerns of insomnia, see MAR. Pt educated on medications.  Pt's safety ensured with 15 minute and environmental checks. Pt currently denies SI/HI and A/V hallucinations. Pt verbally agrees to seek staff if SI/HI or A/VH occurs and to consult with staff before acting on any harmful thoughts. Will continue POC.

## 2017-08-20 NOTE — ED Provider Notes (Signed)
[facility]-EMERGENCY DEPT Provider Note   CSN: [id number] Arrival date & time: 08/20/17  0711     History   Chief Complaint Chief Complaint  Patient presents with  . IVC    HPI Vincent Valenzuela is a 21 y.o. male with history of MDD, PTSD, OCD, social anxiety disorder, and cannabis use disorder presents today with chief complaint of suicidal ideation.  Mother took out IVC on him with concern for suicidal ideation and depression.  Patient states that he recently broke up with his girlfriend and has been feeling increasingly more depressed and aggressive.  He states that he attempted to get a hotel room for the night but could not afford it.  He called his mother and told her that he did not wish to live.  When asked about a plan, he states that several days ago he thought about using a knife but states "I do not think it was sharp enough ".  Denies homicidal ideation or auditory or visual lesions.  Denies alcohol or drug use.  Denies any other medical complaints at this time.  States he has been taking his medications as prescribed.  The history is provided by the patient.    Past Medical History:  Diagnosis Date  . Allergy   . Anxiety   . GERD (gastroesophageal reflux disease)   . Headaches, cluster     Patient Active Problem List   Diagnosis Date Noted  . Cannabis use disorder, moderate, dependence (HCC) 07/07/2017  . Major depressive disorder, recurrent severe without psychotic features (HCC) 07/07/2017  . Overdose 07/07/2017  . PTSD (post-traumatic stress disorder) 07/07/2017  . OCD (obsessive compulsive disorder) 07/07/2017  . Strain of left trapezius muscle 06/26/2015  . Rotator cuff syndrome 03/12/2014  . Back pain 10/04/2013  . Social anxiety disorder 06/14/2013    History reviewed. No pertinent surgical history.     Home Medications    Prior to Admission medications   Medication Sig Start Date End Date Taking? Authorizing Provider    acetaminophen (TYLENOL) 500 MG tablet Take 1,000 mg by mouth every 6 (six) hours as needed for mild pain.   Yes [provider]  fluvoxaMINE (LUVOX) 100 MG tablet Take 1 tablet (100 mg total) by mouth at bedtime. 07/07/17  Yes Pucilowska, Ellin GoodieJolanta B, MD    Family History Family History  Problem Relation Age of Onset  . Lupus Mother   . Depression Mother   . Depression Father   . Alcohol abuse Father   . Hypertension Father   . Lupus Maternal Grandmother   . Diabetes Maternal Grandmother   . Hypertension Maternal Grandmother   . Hyperlipidemia Maternal Grandmother   . Diabetes Maternal Grandfather   . Hypertension Maternal Grandfather   . Hyperlipidemia Maternal Grandfather   . Diabetes Paternal Grandmother   . Hypertension Paternal Grandmother   . Hyperlipidemia Paternal Grandmother   . Diabetes Paternal Grandfather   . Hypertension Paternal Grandfather   . Hyperlipidemia Paternal Grandfather     Social History Social History   Tobacco Use  . Smoking status: Passive Smoke Exposure - Never Smoker  . Smokeless tobacco: Never Used  Substance Use Topics  . Alcohol use: No  . Drug use: No     Allergies   Patient has no known allergies.   Review of Systems Review of Systems  Constitutional: Negative for fever.  Respiratory: Negative for shortness of breath.   Cardiovascular: Negative for chest pain.  Gastrointestinal: Negative for abdominal pain,  nausea and vomiting.  Psychiatric/Behavioral: Positive for suicidal ideas. The patient is nervous/anxious.   All other systems reviewed and are negative.    Physical Exam Updated Vital Signs BP 140/73 (BP Location: Left Arm)   Pulse 64   Temp 98.8 F (37.1 C) (Oral)   Resp 16   Ht 6' (1.829 m)   Wt 104.3 kg (230 lb)   SpO2 100%   BMI 31.19 kg/m   Physical Exam  Constitutional: He appears well-developed and well-nourished. No distress.  HENT:  Head: Normocephalic and atraumatic.  Eyes: Conjunctivae  and EOM are normal. Pupils are equal, round, and reactive to light. Right eye exhibits no discharge. Left eye exhibits no discharge.  Neck: No JVD present. No tracheal deviation present.  Cardiovascular: Normal rate, regular rhythm and normal heart sounds.  Pulmonary/Chest: Effort normal and breath sounds normal.  Abdominal: Soft. Bowel sounds are normal. He exhibits no distension. There is no tenderness.  Musculoskeletal: He exhibits no edema.  Neurological: He is alert.  Skin: Skin is warm and dry. No erythema.  Psychiatric: His speech is normal. His affect is blunt. He is withdrawn. He is not actively hallucinating. He exhibits a depressed mood. He expresses suicidal ideation. He expresses no homicidal ideation. He expresses suicidal plans. He expresses no homicidal plans.  Does not appear to be responding to internal stimuli  Nursing note and vitals reviewed.    ED Treatments / Results  Labs (all labs ordered are listed, but only abnormal results are displayed) Labs Reviewed  COMPREHENSIVE METABOLIC PANEL - Abnormal; Notable for the following components:      Result Value   Glucose, Bld 105 (*)    All other components within normal limits  ACETAMINOPHEN LEVEL - Abnormal; Notable for the following components:   Acetaminophen (Tylenol), Serum <10 (*)    All other components within normal limits  CBC - Abnormal; Notable for the following components:   WBC 10.6 (*)    All other components within normal limits  RAPID URINE DRUG SCREEN, HOSP PERFORMED - Abnormal; Notable for the following components:   Tetrahydrocannabinol POSITIVE (*)    All other components within normal limits  ETHANOL  SALICYLATE LEVEL    EKG  EKG Interpretation None       Radiology No results found.  Procedures Procedures (including critical care time)  Medications Ordered in ED Medications - No data to display   Initial Impression / Assessment and Plan / ED Course  I have reviewed the triage  vital signs and the nursing notes.  Pertinent labs & imaging results that were available during my care of the patient were reviewed by me and considered in my medical decision making (see chart for details).     Patient presents under IVC after expressing suicidal ideation to his mother.  Plan to use a knife.  Afebrile, vital signs are stable.  Physical examination and lab work is reassuring.  UDS shows THC.  He is medically cleared for TTS evaluation at this time.  Final Clinical Impressions(s) / ED Diagnoses   Final diagnoses:  Suicidal ideations    ED Discharge Orders    None       Bennye AlmFawze, Renesmee Raine A, PA-C 08/20/17 1700    Mesner, Barbara CowerJason, MD 08/26/17 [phone]

## 2017-08-20 NOTE — BH Assessment (Signed)
Per Miki KinsLinsey, AC, [facility] after 1 pm to Dr. Jama Flavorsobos. Please call report to adult unit [phone]. Pt will be transported by police.
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
## 2017-08-20 NOTE — ED Notes (Signed)
Bed: [facility] Expected date:  Expected time:  Means of arrival:  Comments: 

## 2017-08-20 NOTE — ED Notes (Signed)
Pt admitted to room #41. Pt behavior calm, guarded. Pt reports break up with girlfriend last night, pt reports "I got angry." Pt reports throwing property.  Pt reports sending his mom text messages stating "I don't want to live." Pt reporting "I didn't mean it, I wanted her attention." Pt denies SI/HI/AVH at this time. Pt reports he is complaint with medication regimen. Encouragement and support provided. Special checks q 15 mins in place for safety, Video monitoring in place. Will continue to monitor.

## 2017-08-20 NOTE — ED Notes (Signed)
Sheriff on unit [facility] order. Personal property given to sheriff for transport. Pt ambulatory off unit in sheriff custody.

## 2017-08-21 DIAGNOSIS — Z560 Unemployment, unspecified: Secondary | ICD-10-CM | POA: Diagnosis not present

## 2017-08-21 DIAGNOSIS — Z813 Family history of other psychoactive substance abuse and dependence: Secondary | ICD-10-CM

## 2017-08-21 DIAGNOSIS — Z630 Problems in relationship with spouse or partner: Secondary | ICD-10-CM

## 2017-08-21 DIAGNOSIS — F332 Major depressive disorder, recurrent severe without psychotic features: Secondary | ICD-10-CM | POA: Diagnosis not present

## 2017-08-21 DIAGNOSIS — Z590 Homelessness: Secondary | ICD-10-CM

## 2017-08-21 DIAGNOSIS — F1994 Other psychoactive substance use, unspecified with psychoactive substance-induced mood disorder: Secondary | ICD-10-CM

## 2017-08-21 DIAGNOSIS — F4325 Adjustment disorder with mixed disturbance of emotions and conduct: Secondary | ICD-10-CM

## 2017-08-21 DIAGNOSIS — Z811 Family history of alcohol abuse and dependence: Secondary | ICD-10-CM

## 2017-08-21 DIAGNOSIS — Z818 Family history of other mental and behavioral disorders: Secondary | ICD-10-CM

## 2017-08-21 MED ORDER — TRAZODONE HCL 50 MG PO TABS
50.0000 mg | ORAL_TABLET | Freq: Every evening | ORAL | Status: DC | PRN
  Administered 2017-08-21 – 2017-08-22 (×3): 50 mg via ORAL
  Filled 2017-08-21 (×11): qty 1

## 2017-08-21 MED ORDER — FLUVOXAMINE MALEATE 50 MG PO TABS
50.0000 mg | ORAL_TABLET | Freq: Every day | ORAL | Status: DC
  Administered 2017-08-21: 50 mg via ORAL
  Filled 2017-08-21 (×3): qty 1

## 2017-08-21 NOTE — H&P (Signed)
Psychiatric Admission Assessment Adult  Patient Identification: Vincent Valenzuela MRN:  [id number] Date of Evaluation:  08/21/2017 Chief Complaint:  Suicidal thoughts. Principal Diagnosis: MDD                                        SUD Diagnosis:   Patient Active Problem List   Diagnosis Date Noted  . Cannabis use disorder, moderate, dependence ([place]) [F12.20] 07/07/2017  . Major depressive disorder, recurrent severe without psychotic features ([place]) [F33.2] 07/07/2017  . Overdose [T50.901A] 07/07/2017  . PTSD (post-traumatic stress disorder) [F43.10] 07/07/2017  . OCD (obsessive compulsive disorder) [F42.9] 07/07/2017  . Strain of left trapezius muscle [S46.812A] 06/26/2015  . Rotator cuff syndrome [M75.100] 03/12/2014  . Back pain [M54.9] 10/04/2013  . Social anxiety disorder [F40.10] 06/14/2013   History of Present Illness:  21 y.o Caucasian male, single, unemployed, lives with his mother. Background history of SUD and Mood disorder. Presented to the ER involuntarily via the police. His mother committed him after he expressed suicidal thoughts. He was reported to have been raging at home. He threatened him mom and girlfriend . Main stressors includes recent breakup from his girlfriend and legal issues.  Routine labs is essentially normal, toxicology is negative,  UDS is positive for THC , BAL<10 mg/dl.  At interview, patient tells me that he was upset because his girlfriend has been cheating and he just found out. Says she has decided to end their relationship. Patient says usually he would turn to alcohol as a way to cope. Says he had been sober for over two weeks. Patient tells me that he took out his frustration in his moms house. Says his mom got scared and asked him and called the police. Patient states that the police initially took him to a hotel. Patient says he had no money to pay for a room. Says he was out in the cold and felt desperate. Says he then called his mother and expressed  suicidal thoughts. Patient says that he did it to gain attention. Says he was desperate to get in somewhere. Patient denies any thoughts of suicide. He denies any homicidal thoughts. Says he just wants to get back on his medications. No evidence of psychosis. No evidence of mania. Says he was doing well until he found out about the cheating. Patient states that he is stressed by not having any income. Says he is due in court next month for probation violation.  Patient denies any access to weapons. He has agreed to recommence Fluvoxamine.   Total Time spent with patient: 1 hour  Past Psychiatric History: Single admission in the past. Was [facility] at [facility] in October 2018. He overdosed at hat time. He denies any other suicidal behavior. He had some benefit from Citalopram in the past. He is currently on Fluvoxamine. No past history of mania. No past history of psychosis.   Is the patient at risk to self? Yes.    Has the patient been a risk to self in the past 6 months? Yes.    Has the patient been a risk to self within the distant past? No.  Is the patient a risk to others? Yes.    Has the patient been a risk to others in the past 6 months? No.  Has the patient been a risk to others within the distant past? No.   Prior Inpatient Therapy:   Prior  Outpatient Therapy:    Alcohol Screening: 1. How often do you have a drink containing alcohol?: 4 or more times a week 2. How many drinks containing alcohol do you have on a typical day when you are drinking?: 7, 8, or 9 3. How often do you have six or more drinks on one occasion?: Weekly AUDIT-C Score: 10 4. How often during the last year have you found that you were not able to stop drinking once you had started?: Less than monthly 5. How often during the last year have you failed to do what was normally expected from you becasue of drinking?: Monthly 6. How often during the last year have you needed a first drink in the morning to get yourself  going after a heavy drinking session?: Weekly 7. How often during the last year have you had a feeling of guilt of remorse after drinking?: Daily or almost daily 8. How often during the last year have you been unable to remember what happened the night before because you had been drinking?: Monthly 9. Have you or someone else been injured as a result of your drinking?: No 10. Has a relative or friend or a doctor or another health worker been concerned about your drinking or suggested you cut down?: Yes, during the last year Alcohol Use Disorder Identification Test Final Score (AUDIT): 26 Intervention/Follow-up: Alcohol Education Substance Abuse History in the last 12 months:  Yes.   Consequences of Substance Abuse: As above  Previous Psychotropic Medications: Yes  Psychological Evaluations: Yes  Past Medical History:  Past Medical History:  Diagnosis Date  . Allergy   . Anxiety   . GERD (gastroesophageal reflux disease)   . Headaches, cluster    History reviewed. No pertinent surgical history. Family History:  Family History  Problem Relation Age of Onset  . Lupus Mother   . Depression Mother   . Depression Father   . Alcohol abuse Father   . Hypertension Father   . Lupus Maternal Grandmother   . Diabetes Maternal Grandmother   . Hypertension Maternal Grandmother   . Hyperlipidemia Maternal Grandmother   . Diabetes Maternal Grandfather   . Hypertension Maternal Grandfather   . Hyperlipidemia Maternal Grandfather   . Diabetes Paternal Grandmother   . Hypertension Paternal Grandmother   . Hyperlipidemia Paternal Grandmother   . Diabetes Paternal Grandfather   . Hypertension Paternal Grandfather   . Hyperlipidemia Paternal Grandfather    Family Psychiatric  History: Father has depression and SUD. Mother suffers from OCD Tobacco Screening: Have you used any form of tobacco in the last 30 days? (Cigarettes, Smokeless Tobacco, Cigars, and/or Pipes): No Social History:  Social  History   Substance and Sexual Activity  Alcohol Use No     Social History   Substance and Sexual Activity  Drug Use No    Additional Social History: Marital status: Long term relationship Long term relationship, how long?: 2 years What types of issues is patient dealing with in the relationship?: Not sure if they are in the middle of a break up or not.  Both have agreed to work on themselves.  Last talked to her about them just being friends for a couple of weeks. Are you sexually active?: Yes What is your sexual orientation?: Straight Has your sexual activity been affected by drugs, alcohol, medication, or emotional stress?: Alcohol Does patient have children?: No  Allergies:  No Known Allergies Lab Results:  Results for orders placed or performed during the hospital encounter of 08/20/17 (from the past 48 hour(s))  Comprehensive metabolic panel     Status: Abnormal   Collection Time: 08/20/17  7:13 AM  Result Value Ref Range   Sodium 139 135 - 145 mmol/L   Potassium 3.8 3.5 - 5.1 mmol/L   Chloride 109 101 - 111 mmol/L   CO2 24 22 - 32 mmol/L   Glucose, Bld 105 (H) 65 - 99 mg/dL   BUN 14 6 - 20 mg/dL   Creatinine, Ser 0.86 0.61 - 1.24 mg/dL   Calcium 9.4 8.9 - 10.3 mg/dL   Total Protein 7.6 6.5 - 8.1 g/dL   Albumin 4.4 3.5 - 5.0 g/dL   AST 28 15 - 41 U/L   ALT 30 17 - 63 U/L   Alkaline Phosphatase 44 38 - 126 U/L   Total Bilirubin 0.4 0.3 - 1.2 mg/dL   GFR calc non Af Amer >60 >60 mL/min   GFR calc Af Amer >60 >60 mL/min    Comment: (NOTE) The eGFR has been calculated using the CKD EPI equation. This calculation has not been validated in all clinical situations. eGFR's persistently <60 mL/min signify possible Chronic Kidney Disease.    Anion gap 6 5 - 15  cbc     Status: Abnormal   Collection Time: 08/20/17  7:13 AM  Result Value Ref Range   WBC 10.6 (H) 4.0 - 10.5 K/uL   RBC 4.83 4.22 - 5.81 MIL/uL   Hemoglobin 14.1 13.0 - 17.0  g/dL   HCT 42.5 39.0 - 52.0 %   MCV 88.0 78.0 - 100.0 fL   MCH 29.2 26.0 - 34.0 pg   MCHC 33.2 30.0 - 36.0 g/dL   RDW 12.7 11.5 - 15.5 %   Platelets 283 150 - 400 K/uL  Rapid urine drug screen (hospital performed)     Status: Abnormal   Collection Time: 08/20/17  7:13 AM  Result Value Ref Range   Opiates NONE DETECTED NONE DETECTED   Cocaine NONE DETECTED NONE DETECTED   Benzodiazepines NONE DETECTED NONE DETECTED   Amphetamines NONE DETECTED NONE DETECTED   Tetrahydrocannabinol POSITIVE (A) NONE DETECTED   Barbiturates NONE DETECTED NONE DETECTED    Comment:        DRUG SCREEN FOR MEDICAL PURPOSES ONLY.  IF CONFIRMATION IS NEEDED FOR ANY PURPOSE, NOTIFY LAB WITHIN 5 DAYS.        LOWEST DETECTABLE LIMITS FOR URINE DRUG SCREEN Drug Class       Cutoff (ng/mL) Amphetamine      1000 Barbiturate      200 Benzodiazepine   785 Tricyclics       885 Opiates          300 Cocaine          300 THC              50   Ethanol     Status: None   Collection Time: 08/20/17  7:14 AM  Result Value Ref Range   Alcohol, Ethyl (B) <10 <10 mg/dL    Comment:        LOWEST DETECTABLE LIMIT FOR SERUM ALCOHOL IS 10 mg/dL FOR MEDICAL PURPOSES ONLY   Salicylate level     Status: None   Collection Time: 08/20/17  7:14 AM  Result Value Ref Range   Salicylate Lvl <0.2 2.8 - 30.0 mg/dL  Acetaminophen level  Status: Abnormal   Collection Time: 08/20/17  7:14 AM  Result Value Ref Range   Acetaminophen (Tylenol), Serum <10 (L) 10 - 30 ug/mL    Comment:        THERAPEUTIC CONCENTRATIONS VARY SIGNIFICANTLY. A RANGE OF 10-30 ug/mL MAY BE AN EFFECTIVE CONCENTRATION FOR MANY PATIENTS. HOWEVER, SOME ARE BEST TREATED AT CONCENTRATIONS OUTSIDE THIS RANGE. ACETAMINOPHEN CONCENTRATIONS >150 ug/mL AT 4 HOURS AFTER INGESTION AND >50 ug/mL AT 12 HOURS AFTER INGESTION ARE OFTEN ASSOCIATED WITH TOXIC REACTIONS.     Blood Alcohol level:  Lab Results  Component Value Date   ETH <10 08/20/2017    ETH 18 (H) 62/56/3893    Metabolic Disorder Labs:  No results found for: HGBA1C, MPG No results found for: PROLACTIN No results found for: CHOL, TRIG, HDL, CHOLHDL, VLDL, LDLCALC  Current Medications: Current Facility-Administered Medications  Medication Dose Route Frequency Provider Last Rate Last Dose  . acetaminophen (TYLENOL) tablet 650 mg  650 mg Oral Q6H PRN Patrecia Pour, NP   650 mg at 08/21/17 0748  . alum & mag hydroxide-simeth (MAALOX/MYLANTA) 200-200-20 MG/5ML suspension 30 mL  30 mL Oral Q4H PRN Patrecia Pour, NP      . fluvoxaMINE (LUVOX) tablet 50 mg  50 mg Oral QHS Izediuno, Vincent A, MD      . magnesium hydroxide (MILK OF MAGNESIA) suspension 30 mL  30 mL Oral Daily PRN Patrecia Pour, NP       PTA Medications: Medications Prior to Admission  Medication Sig Dispense Refill Last Dose  . acetaminophen (TYLENOL) 500 MG tablet Take 1,000 mg by mouth every 6 (six) hours as needed for mild pain.   Past Week at Unknown time  . fluvoxaMINE (LUVOX) 100 MG tablet Take 1 tablet (100 mg total) by mouth at bedtime. 30 tablet 1 08/19/2017 at Unknown time    Musculoskeletal: Strength & Muscle Tone: within normal limits Gait & Station: normal Patient leans: N/A  Psychiatric Specialty Exam: Physical Exam  Constitutional: He is oriented to person, place, and time. He appears well-developed and well-nourished.  HENT:  Head: Normocephalic and atraumatic.  Respiratory: Effort normal.  Neurological: He is alert and oriented to person, place, and time.  Psychiatric:  As above    ROS  Blood pressure 126/75, pulse 80, temperature 97.7 F (36.5 C), temperature source Oral, resp. rate 18, height 5' 9" (1.753 m), weight 106.6 kg (235 lb).Body mass index is 34.7 kg/m.  General Appearance: Neatly dressed, calm and cooperative. Was watching TV just prior to interview. Not internally distracted.   Eye Contact:  Good  Speech:  Clear and Coherent and Normal Rate  Volume:  Normal   Mood:  Depressed  Affect:  Appropriate and Restricted  Thought Process:  Linear  Orientation:  Full (Time, Place, and Person)  Thought Content:  Rumination  Suicidal Thoughts:  Denies any current suicidal thoughts  Homicidal Thoughts:  Denies any homicidal or violent thoughts.   Memory:  Immediate;   Good Recent;   Good Remote;   Good  Judgement:  Fair  Insight:  Good  Psychomotor Activity:  Normal  Concentration:  Concentration: Good and Attention Span: Good  Recall:  Good  Fund of Knowledge:  Good  Language:  Good  Akathisia:  Negative  Handed:    AIMS (if indicated):     Assets:  Communication Skills Desire for Improvement Physical Health Resilience  ADL's:  Intact  Cognition:  WNL  Sleep:  Number of Hours: 6.75  Treatment Plan Summary: Patient is depressed and overwhelmed by recent end of his relationship. Depression is perpetuated by pending legal issues. Unemployment, homelessness are also worsening his depression. He has agreed to restart Luvox. We would evaluate him further.  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   PLAN: 1. Increase Fluvoxamine to 50 mg at bedtime 2. Encourage unit groups and activities 3. Monitor mood, behavior and interaction with peers 4. SW would gather collateral from his family  5. SW would coordinate aftercare.   Observation Level/Precautions:  15 minute checks  Laboratory:    Psychotherapy:    Medications:    Consultations:    Discharge Concerns:    Estimated LOS:  Other:     Physician Treatment Plan for Primary Diagnosis: <principal problem not specified> Long Term Goal(s): Improvement in symptoms so as ready for discharge  Short Term Goals: Ability to identify changes in lifestyle to reduce recurrence of condition will improve, Ability to verbalize feelings will  improve, Ability to disclose and discuss suicidal ideas, Ability to demonstrate self-control will improve, Ability to identify and develop effective coping behaviors will improve, Ability to maintain clinical measurements within normal limits will improve, Compliance with prescribed medications will improve and Ability to identify triggers associated with substance abuse/mental health issues will improve  Physician Treatment Plan for Secondary Diagnosis: Active Problems:   Major depressive disorder, recurrent severe without psychotic features ([place])  Long Term Goal(s): Improvement in symptoms so as ready for discharge  Short Term Goals: Ability to identify changes in lifestyle to reduce recurrence of condition will improve, Ability to verbalize feelings will improve, Ability to disclose and discuss suicidal ideas, Ability to demonstrate self-control will improve, Ability to identify and develop effective coping behaviors will improve, Ability to maintain clinical measurements within normal limits will improve, Compliance with prescribed medications will improve and Ability to identify triggers associated with substance abuse/mental health issues will improve  I certify that inpatient services furnished can reasonably be expected to improve the patient's condition.    [place], MD 11/26/20183:45 PM

## 2017-08-21 NOTE — Progress Notes (Signed)
Patient ID: Vincent Valenzuela, male   DOB: 1995-12-11, 21 y.o.   MRN: [id number]  DAR: Pt. Denies SI/HI and A/V Hallucinations. He reports that his sleep last night was fair, his appetite is fair, his energy level is normal, and concentration is poor. He rates her depression level 6/10, his hopelessness level is 2/10, and his anxiety level is 3/10. Patient does report some neck pain this morning and received PRN Tylenol for pain. He reports some relief on reassessment. Support and encouragement provided to the patient. No scheduled medications at this time. Patient is seen in the milieu, mostly in the dayroom interacting with his peers throughout the day. He is minimal but cooperative with staff. This evening he reports to writer that he has some numbness and tingling in his right pinky finger and his toes. MD Izediuno was notified of patients report. Patient does not appear in any acute distress at this time and is seen laughing on the phone shortly after this report. His gait is steady at this time. Q15 minute checks are maintained for safety.

## 2017-08-21 NOTE — Progress Notes (Signed)
Adult Psychoeducational Group Note  Date:  08/21/2017 Time:  9:44 PM  Group Topic/Focus:  Wrap-Up Group:   The focus of this group is to help patients review their daily goal of treatment and discuss progress on daily workbooks.  Participation Level:  Active  Participation Quality:  Appropriate  Affect:  Appropriate  Cognitive:  Appropriate  Insight: Appropriate  Engagement in Group:  Engaged  Modes of Intervention:  Discussion  Additional Comments:  Pt stated his goal for today was to focus on his treatment. Pt stated he accomplished his goal today. Pt rated his day a 9 out of 10. Pt stated the visited by his family help improve his day. Pt stated he attended all groups held today.  Felipa FurnaceChristopher  Enjoli Tidd 08/21/2017, 9:44 PM

## 2017-08-21 NOTE — Progress Notes (Signed)
Recreation Therapy Notes  Date: 08/21/17 Time:  0930 Location: [address] Dayroom  Group Topic: Stress Management  Goal Area(s) Addresses:  Patient will verbalize importance of using healthy stress management.  Patient will identify positive emotions associated with healthy stress management.   Behavioral Response: Engaged  Intervention: Stress Management  Activity : Guided Imagery.  LRT introduced the stress management technique of guided imagery.  LRT read a script that allowed patients to envision listening to the waves at the beach.  Patients were to follow along as LRT read script.  Education:  Stress Management, Discharge Planning.   Education Outcome: Acknowledges edcuation/In group clarification offered/Needs additional education  Clinical Observations/Feedback: Pt attended group.   Keva Darty, LRT/CTRS         Chyrl Elwell A 08/21/2017 12:09 PM 

## 2017-08-21 NOTE — Progress Notes (Signed)
Pt attend wrap up group. Hi day was a 7. His goal focous his mind. He trying to change his mind.

## 2017-08-21 NOTE — Progress Notes (Signed)
Patient ID: Vincent Valenzuela, male   DOB: 10-27-1995, 21 y.o.   MRN: [id number]  Pt currently presents with a blunted affect and cooperative behavior. Pt reports to writer that their goal is to "just keep doing what I am doing." Pt states "It was a good day today." Pt reports good sleep with current medication regimen.   Pt provided with medications per providers orders. Pt's labs and vitals were monitored throughout the night. Pt given a 1:1 about emotional and mental status. Pt supported and encouraged to express concerns and questions. Pt educated on medications. Provider notified of patients satisfaction with sleep meds, see MAR.   Pt's safety ensured with 15 minute and environmental checks. Pt currently denies SI/HI and A/V hallucinations. Pt verbally agrees to seek staff if SI/HI or A/VH occurs and to consult with staff before acting on any harmful thoughts. Will continue POC.

## 2017-08-21 NOTE — [facility] Suicide Risk Assessment (Signed)
[facility]   Nursing information obtained from:  Patient, Review of record Demographic factors:  Male, Adolescent or young adult, Unemployed Current Mental Status:  Suicidal ideation indicated by patient, Suicide plan, Plan includes specific time, place, or method, Self-harm thoughts, Intention to act on suicide plan Loss Factors:  Decrease in vocational status, Legal issues Historical Factors:  Family history of mental illness or substance abuse, Impulsivity Risk Reduction Factors:  Sense of responsibility to family, Living with another person, especially a relative, Positive social support, Positive therapeutic relationship  Total Time spent with patient: 30 minutes Principal Problem: <principal problem not specified> Diagnosis:   Patient Active Problem List   Diagnosis Date Noted  . Cannabis use disorder, moderate, dependence (HCC) [F12.20] 07/07/2017  . Major depressive disorder, recurrent severe without psychotic features (HCC) [F33.2] 07/07/2017  . Overdose [T50.901A] 07/07/2017  . PTSD (post-traumatic stress disorder) [F43.10] 07/07/2017  . OCD (obsessive compulsive disorder) [F42.9] 07/07/2017  . Strain of left trapezius muscle [S46.812A] 06/26/2015  . Rotator cuff syndrome [M75.100] 03/12/2014  . Back pain [M54.9] 10/04/2013  . Social anxiety disorder [F40.10] 06/14/2013   Subjective Data:  21 y.o Caucasian male, single, unemployed, lives with his mother. Background history of SUD and Mood disorder. Presented to the ER involuntarily via the police. His mother committed him after he expressed suicidal thoughts. He was reported to have been raging at home. He threatened him mom and girlfriend . Main stressors includes recent breakup from his girlfriend and legal issues.  Routine labs is essentially normal, toxicology is negative,  UDS is positive for THC , BAL<10 mg/dl. Patient has past suicidal behavior. He has family history of mental illness. Patient has  pending legal issues. He is dealing with a recent breakup. He is not psychotic. He is not manic. No cognitive impairment. No access to weapons. He is cooperative with care. He  has agreed to treatment recommendations. He has agreed to communicate suicidal thoughts of with staff if the thoughts becomes overwhelming.    Continued Clinical Symptoms:  Alcohol Use Disorder Identification Test Final Score (AUDIT): 26 The "Alcohol Use Disorders Identification Test", Guidelines for Use in Primary Care, Second Edition.  World Science writerHealth Organization [facility](WHO). Score between 0-7:  no or low risk or alcohol related problems. Score between 8-15:  moderate risk of alcohol related problems. Score between 16-19:  high risk of alcohol related problems. Score 20 or above:  warrants further diagnostic evaluation for alcohol dependence and treatment.   CLINICAL FACTORS:   Depression:   Severe   Musculoskeletal: Strength & Muscle Tone: within normal limits Gait & Station: normal Patient leans: N/A  Psychiatric Specialty Exam: Physical Exam  ROS  Blood pressure 126/75, pulse 80, temperature 97.7 F (36.5 C), temperature source Oral, resp. rate 18, height 5\' 9"  (1.753 m), weight 106.6 kg (235 lb).Body mass index is 34.7 kg/m.  General Appearance: As in H&P  Eye Contact:    Speech:    Volume:    Mood:    Affect:    Thought Process:    Orientation:    Thought Content:  As in H&P  Suicidal Thoughts:    Homicidal Thoughts:    Memory:    Judgement:    Insight:    Psychomotor Activity:    Concentration:    Recall:    Fund of Knowledge:  As in H&P  Language:    Akathisia:    Handed:    AIMS (if indicated):     Assets:  ADL's:    Cognition:  As in H&P  Sleep:  Number of Hours: 6.75      COGNITIVE FEATURES THAT CONTRIBUTE TO RISK:  None    SUICIDE RISK:   Moderate:  Frequent suicidal ideation with limited intensity, and duration, some specificity in terms of plans, no associated intent, good  self-control, limited dysphoria/symptomatology, some risk factors present, and identifiable protective factors, including available and accessible social support.  PLAN OF CARE:  As in H&P  I certify that inpatient services furnished can reasonably be expected to improve the patient's condition.   Georgiann CockerVincent A Izediuno, MD 08/21/2017, 4:06 PM

## 2017-08-22 DIAGNOSIS — G4700 Insomnia, unspecified: Secondary | ICD-10-CM

## 2017-08-22 DIAGNOSIS — Z818 Family history of other mental and behavioral disorders: Secondary | ICD-10-CM | POA: Diagnosis not present

## 2017-08-22 DIAGNOSIS — F4325 Adjustment disorder with mixed disturbance of emotions and conduct: Secondary | ICD-10-CM | POA: Diagnosis not present

## 2017-08-22 DIAGNOSIS — F419 Anxiety disorder, unspecified: Secondary | ICD-10-CM

## 2017-08-22 DIAGNOSIS — F332 Major depressive disorder, recurrent severe without psychotic features: Secondary | ICD-10-CM | POA: Diagnosis not present

## 2017-08-22 DIAGNOSIS — R450 Nervousness: Secondary | ICD-10-CM

## 2017-08-22 DIAGNOSIS — Z811 Family history of alcohol abuse and dependence: Secondary | ICD-10-CM | POA: Diagnosis not present

## 2017-08-22 MED ORDER — FLUVOXAMINE MALEATE 100 MG PO TABS
100.0000 mg | ORAL_TABLET | Freq: Every day | ORAL | Status: DC
Start: 2017-08-22 — End: 2017-08-24
  Administered 2017-08-22 – 2017-08-23 (×2): 100 mg via ORAL
  Filled 2017-08-22: qty 1
  Filled 2017-08-22: qty 7
  Filled 2017-08-22 (×2): qty 1
  Filled 2017-08-22: qty 7

## 2017-08-22 MED ORDER — QUETIAPINE FUMARATE 25 MG PO TABS
25.0000 mg | ORAL_TABLET | Freq: Two times a day (BID) | ORAL | Status: DC
  Administered 2017-08-22 – 2017-08-24 (×4): 25 mg via ORAL
  Filled 2017-08-22 (×2): qty 1
  Filled 2017-08-22: qty 14
  Filled 2017-08-22: qty 1
  Filled 2017-08-22: qty 14
  Filled 2017-08-22: qty 1
  Filled 2017-08-22: qty 14
  Filled 2017-08-22: qty 1
  Filled 2017-08-22: qty 14
  Filled 2017-08-22: qty 1

## 2017-08-22 NOTE — Progress Notes (Signed)
D:Pt presents with a flat affect and anxious mood. Pt reports decreased depression and anxiety today. Pt denies SI. Pt reports poor sleep last night. Pt reports decreased anger episodes. No aggressive behaviors observed on the unit. Pt compliant with meds. No side effects to meds verbalized by pt.  A: Medications administered as ordered per MD. Verbal support provided. Pt encouraged to attend groups. 15 minute checks performed for safety. R: Pt compliant with tx.

## 2017-08-22 NOTE — Plan of Care (Signed)
Pt able to maintain control. No inappropriate behaviors noted.

## 2017-08-22 NOTE — [facility] Suicide Risk Assessment (Signed)
[facility] INPATIENT:  Family/Significant Other Suicide Prevention Education  Suicide Prevention Education:  Education Completed;Vincent Valenzuela, Vincent Valenzuela, Vincent Valenzuela  has been identified by the patient as the family member/significant other with whom the patient will be residing, and identified as the person(s) who will aid the patient in the event of a mental health crisis (suicidal ideations/suicide attempt).  With written consent from the patient, the family member/significant other has been provided the following suicide prevention education, prior to the and/or following the discharge of the patient.  The suicide prevention education provided includes the following:  Suicide risk factors  Suicide prevention and interventions  National Suicide Hotline telephone number  [facility] assessment telephone number  [facility] Assistance 911  [facility] and/or [facility]idential [facility] telephone number  Request made of family/significant other to:  Remove weapons (e.g., guns, rifles, knives), all items previously/currently identified as safety concern.    Remove drugs/medications (over-the-counter, prescriptions, illicit drugs), all items previously/currently identified as a safety concern.  The family member/significant other verbalizes understanding of the suicide prevention education information provided.  The family member/significant other agrees to remove the items of safety concern listed above.  Vincent Valenzuela is conerned about patient's relationship with ex-hopes they do not reunite.  She is also concerned that pt will not take meds and follow up at outpt as he is promising. She asked that we refer him for  care coordination.  Vincent Valenzuela 08/22/2017, 2:51 PM

## 2017-08-22 NOTE — Tx Team (Signed)
Interdisciplinary Treatment and Diagnostic Plan Update  08/22/2017 Time of Session: 8:56 AM  Vincent Valenzuela MRN: [id number]  Principal Diagnosis: Major depressive disorder, recurrent severe without psychotic features ([place])  Secondary Diagnoses: Principal Problem:   Major depressive disorder, recurrent severe without psychotic features ([place]) Active Problems:   Adjustment disorder with mixed disturbance of emotions and conduct   Current Medications:  Current Facility-Administered Medications  Medication Dose Route Frequency Provider Last Rate Last Dose  . acetaminophen (TYLENOL) tablet 650 mg  650 mg Oral Q6H PRN Patrecia Pour, NP   650 mg at 08/21/17 0748  . alum & mag hydroxide-simeth (MAALOX/MYLANTA) 200-200-20 MG/5ML suspension 30 mL  30 mL Oral Q4H PRN Patrecia Pour, NP      . fluvoxaMINE (LUVOX) tablet 50 mg  50 mg Oral QHS Artist Beach, MD   50 mg at 08/21/17 2243  . magnesium hydroxide (MILK OF MAGNESIA) suspension 30 mL  30 mL Oral Daily PRN Patrecia Pour, NP      . traZODone (DESYREL) tablet 50 mg  50 mg Oral QHS,MR X 1 Lindon Romp A, NP   50 mg at 08/21/17 2359    PTA Medications: Medications Prior to Admission  Medication Sig Dispense Refill Last Dose  . acetaminophen (TYLENOL) 500 MG tablet Take 1,000 mg by mouth every 6 (six) hours as needed for mild pain.   Past Week at Unknown time  . fluvoxaMINE (LUVOX) 100 MG tablet Take 1 tablet (100 mg total) by mouth at bedtime. 30 tablet 1 08/19/2017 at Unknown time    Patient Stressors: Legal issue Marital or family conflict Occupational concerns Substance abuse  Patient Strengths: Average or above average intelligence Communication skills General fund of knowledge Motivation for treatment/growth Physical Health Supportive family/friends Work skills  Treatment Modalities: Medication Management, Group therapy, Case management,  1 to 1 session with clinician, Psychoeducation, Recreational  therapy.   Physician Treatment Plan for Primary Diagnosis: Major depressive disorder, recurrent severe without psychotic features ([place]) Long Term Goal(s): Improvement in symptoms so as ready for discharge  Short Term Goals: Ability to identify changes in lifestyle to reduce recurrence of condition will improve Ability to verbalize feelings will improve Ability to disclose and discuss suicidal ideas Ability to demonstrate self-control will improve Ability to identify and develop effective coping behaviors will improve Ability to maintain clinical measurements within normal limits will improve Compliance with prescribed medications will improve Ability to identify triggers associated with substance abuse/mental health issues will improve Ability to identify changes in lifestyle to reduce recurrence of condition will improve Ability to verbalize feelings will improve Ability to disclose and discuss suicidal ideas Ability to demonstrate self-control will improve Ability to identify and develop effective coping behaviors will improve Ability to maintain clinical measurements within normal limits will improve Compliance with prescribed medications will improve Ability to identify triggers associated with substance abuse/mental health issues will improve  Medication Management: Evaluate patient's response, side effects, and tolerance of medication regimen.  Therapeutic Interventions: 1 to 1 sessions, Unit Group sessions and Medication administration.  Evaluation of Outcomes: Progressing  Physician Treatment Plan for Secondary Diagnosis: Principal Problem:   Major depressive disorder, recurrent severe without psychotic features ([place]) Active Problems:   Adjustment disorder with mixed disturbance of emotions and conduct   Long Term Goal(s): Improvement in symptoms so as ready for discharge  Short Term Goals: Ability to identify changes in lifestyle to reduce recurrence of condition will  improve Ability to verbalize feelings will improve Ability  to disclose and discuss suicidal ideas Ability to demonstrate self-control will improve Ability to identify and develop effective coping behaviors will improve Ability to maintain clinical measurements within normal limits will improve Compliance with prescribed medications will improve Ability to identify triggers associated with substance abuse/mental health issues will improve Ability to identify changes in lifestyle to reduce recurrence of condition will improve Ability to verbalize feelings will improve Ability to disclose and discuss suicidal ideas Ability to demonstrate self-control will improve Ability to identify and develop effective coping behaviors will improve Ability to maintain clinical measurements within normal limits will improve Compliance with prescribed medications will improve Ability to identify triggers associated with substance abuse/mental health issues will improve  Medication Management: Evaluate patient's response, side effects, and tolerance of medication regimen.  Therapeutic Interventions: 1 to 1 sessions, Unit Group sessions and Medication administration.  Evaluation of Outcomes: Progressing   RN Treatment Plan for Primary Diagnosis: Major depressive disorder, recurrent severe without psychotic features ([place]) Long Term Goal(s): Knowledge of disease and therapeutic regimen to maintain health will improve  Short Term Goals: Ability to identify and develop effective coping behaviors will improve and Compliance with prescribed medications will improve  Medication Management: RN will administer medications as ordered by provider, will assess and evaluate patient's response and provide education to patient for prescribed medication. RN will report any adverse and/or side effects to prescribing provider.  Therapeutic Interventions: 1 on 1 counseling sessions, Psychoeducation, Medication administration,  Evaluate responses to treatment, Monitor vital signs and CBGs as ordered, Perform/monitor CIWA, COWS, AIMS and Fall Risk screenings as ordered, Perform wound care treatments as ordered.  Evaluation of Outcomes: Progressing   LCSW Treatment Plan for Primary Diagnosis: Major depressive disorder, recurrent severe without psychotic features ([place]) Long Term Goal(s): Safe transition to appropriate next level of care at discharge, Engage patient in therapeutic group addressing interpersonal concerns.  Short Term Goals: Engage patient in aftercare planning with referrals and resources  Therapeutic Interventions: Assess for all discharge needs, 1 to 1 time with Social worker, Explore available resources and support systems, Assess for adequacy in community support network, Educate family and significant other(s) on suicide prevention, Complete Psychosocial Assessment, Interpersonal group therapy.  Evaluation of Outcomes: Met  Return home, follow up outpt.   Progress in Treatment: Attending groups: Yes Participating in groups: Yes Taking medication as prescribed: Yes Toleration medication: Yes, no side effects reported at this time Family/Significant other contact made:  Patient understands diagnosis: Yes AEB Discussing patient identified problems/goals with staff: Yes Medical problems stabilized or resolved: Yes Denies suicidal/homicidal ideation: Yes Issues/concerns per patient self-inventory: None Other: N/A  New problem(s) identified: None identified at this time.   New Short Term/Long Term Goal(s):    "Find out how to deal with depression moving forward."      Discharge Plan or Barriers:   Reason for Continuation of Hospitalization: Anxiety Depression Medication stabilization Withdrawal symptoms  Estimated Length of Stay: 11/30  Attendees: Patient: Vincent Valenzuela 08/22/2017  8:56 AM  Physician: Neita Garnet, MD 08/22/2017  8:56 AM  Nursing: Sena Hitch, RN  08/22/2017  8:56 AM  RN Care Manager: Lars Pinks, RN 08/22/2017  8:56 AM  Social Worker: Ripley Fraise 08/22/2017  8:56 AM  Recreational Therapist: Winfield Cunas 08/22/2017  8:56 AM  Other: Norberto Sorenson 08/22/2017  8:56 AM  Other:  08/22/2017  8:56 AM    Scribe for Treatment Team:  Roque Lias LCSW 08/22/2017 8:56 AM

## 2017-08-22 NOTE — Progress Notes (Signed)
[facility]  08/22/2017 3:07 PM Roger KillKenneth E Dingwall  MRN:  [id number]   Subjective:  Patient reports that he is doing well today. He states that he just gets agitated and his anxiety gets high at home and then he just "kind of goes off." Patient agrees that another medication would be beneficial.   Objective: Patient's chart and findings reviewed and discussed with treatment team. Patient is pleasant and cooperative. Will increase Luvox back to home dose of 100 mg. Will add Seroquel 25 mg BID for agitation/anxiety. Patient has been seen interacting appropriately with peers and staff.  Principal Problem: Major depressive disorder, recurrent severe without psychotic features (HCC) Diagnosis:   Patient Active Problem List   Diagnosis Date Noted  . Adjustment disorder with mixed disturbance of emotions and conduct [F43.25] 08/21/2017  . Cannabis use disorder, moderate, dependence (HCC) [F12.20] 07/07/2017  . Major depressive disorder, recurrent severe without psychotic features (HCC) [F33.2] 07/07/2017  . Overdose [T50.901A] 07/07/2017  . PTSD (post-traumatic stress disorder) [F43.10] 07/07/2017  . OCD (obsessive compulsive disorder) [F42.9] 07/07/2017  . Strain of left trapezius muscle [S46.812A] 06/26/2015  . Rotator cuff syndrome [M75.100] 03/12/2014  . Back pain [M54.9] 10/04/2013  . Social anxiety disorder [F40.10] 06/14/2013   Total Time spent with patient: 25 minutes  Past Psychiatric History: See H&P  Past Medical History:  Past Medical History:  Diagnosis Date  . Allergy   . Anxiety   . GERD (gastroesophageal reflux disease)   . Headaches, cluster    History reviewed. No pertinent surgical history. Family History:  Family History  Problem Relation Age of Onset  . Lupus Mother   . Depression Mother   . Depression Father   . Alcohol abuse Father   . Hypertension Father   . Lupus Maternal Grandmother   . Diabetes Maternal Grandmother   . Hypertension Maternal  Grandmother   . Hyperlipidemia Maternal Grandmother   . Diabetes Maternal Grandfather   . Hypertension Maternal Grandfather   . Hyperlipidemia Maternal Grandfather   . Diabetes Paternal Grandmother   . Hypertension Paternal Grandmother   . Hyperlipidemia Paternal Grandmother   . Diabetes Paternal Grandfather   . Hypertension Paternal Grandfather   . Hyperlipidemia Paternal Grandfather    Family Psychiatric  History: See H&P Social History:  Social History   Substance and Sexual Activity  Alcohol Use No     Social History   Substance and Sexual Activity  Drug Use No    Social History   Socioeconomic History  . Marital status: Single    Spouse name: None  . Number of children: None  . Years of education: None  . Highest education level: None  Social Needs  . Financial resource strain: None  . Food insecurity - worry: None  . Food insecurity - inability: None  . Transportation needs - medical: None  . Transportation needs - non-medical: None  Occupational History  . None  Tobacco Use  . Smoking status: Passive Smoke Exposure - Never Smoker  . Smokeless tobacco: Never Used  Substance and Sexual Activity  . Alcohol use: No  . Drug use: No  . Sexual activity: None  Other Topics Concern  . None  Social History Narrative  . None   Additional Social History:                         Sleep: Good  Appetite:  Good  Current Medications: Current Facility-Administered Medications  Medication Dose  Route Frequency Provider Last Rate Last Dose  . acetaminophen (TYLENOL) tablet 650 mg  650 mg Oral Q6H PRN Charm Rings, NP   650 mg at 08/22/17 0935  . alum & mag hydroxide-simeth (MAALOX/MYLANTA) 200-200-20 MG/5ML suspension 30 mL  30 mL Oral Q4H PRN Charm Rings, NP      . fluvoxaMINE (LUVOX) tablet 100 mg  100 mg Oral QHS Money, Travis B, FNP      . magnesium hydroxide (MILK OF MAGNESIA) suspension 30 mL  30 mL Oral Daily PRN Charm Rings, NP      .  QUEtiapine (SEROQUEL) tablet 25 mg  25 mg Oral BID Money, Feliz Beam B, FNP      . traZODone (DESYREL) tablet 50 mg  50 mg Oral QHS,MR X 1 Nira Conn A, NP   50 mg at 08/21/17 2359    Lab Results: No results found for this or any previous visit (from the past 48 hour(s)).  Blood Alcohol level:  Lab Results  Component Value Date   ETH <10 08/20/2017   ETH 18 (H) 07/05/2017    Metabolic Disorder Labs: No results found for: HGBA1C, MPG No results found for: PROLACTIN No results found for: CHOL, TRIG, HDL, CHOLHDL, VLDL, LDLCALC  Physical Findings: AIMS: Facial and Oral Movements Muscles of Facial Expression: None, normal Lips and Perioral Area: None, normal Jaw: None, normal Tongue: None, normal,Extremity Movements Upper (arms, wrists, hands, fingers): None, normal Lower (legs, knees, ankles, toes): None, normal, Trunk Movements Neck, shoulders, hips: None, normal, Overall Severity Severity of abnormal movements (highest score from questions above): None, normal Incapacitation due to abnormal movements: None, normal Patient's awareness of abnormal movements (rate only patient's report): No Awareness, Dental Status Current problems with teeth and/or dentures?: No Does patient usually wear dentures?: No  CIWA:    COWS:     Musculoskeletal: Strength & Muscle Tone: within normal limits Gait & Station: normal Patient leans: N/A  Psychiatric Specialty Exam: Physical Exam  Nursing note and vitals reviewed. Constitutional: He is oriented to person, place, and time. He appears well-developed and well-nourished.  Cardiovascular: Normal rate.  Respiratory: Effort normal.  Musculoskeletal: Normal range of motion.  Neurological: He is alert and oriented to person, place, and time.  Skin: Skin is warm.    Review of Systems  Constitutional: Negative.   HENT: Negative.   Eyes: Negative.   Respiratory: Negative.   Cardiovascular: Negative.   Gastrointestinal: Negative.    Genitourinary: Negative.   Musculoskeletal: Negative.   Skin: Negative.   Neurological: Negative.   Endo/Heme/Allergies: Negative.   Psychiatric/Behavioral: The patient is nervous/anxious.     Blood pressure (!) 152/77, pulse 72, temperature 97.7 F (36.5 C), temperature source Oral, resp. rate 18, height 5\' 9"  (1.753 m), weight 106.6 kg (235 lb).Body mass index is 34.7 kg/m.  General Appearance: Casual  Eye Contact:  Good  Speech:  Clear and Coherent and Normal Rate  Volume:  Normal  Mood:  Anxious  Affect:  Congruent  Thought Process:  Goal Directed and Descriptions of Associations: Intact  Orientation:  Full (Time, Place, and Person)  Thought Content:  WDL  Suicidal Thoughts:  No  Homicidal Thoughts:  No  Memory:  Immediate;   Good Recent;   Good Remote;   Good  Judgement:  Good  Insight:  Good  Psychomotor Activity:  Normal  Concentration:  Concentration: Good and Attention Span: Good  Recall:  Good  Fund of Knowledge:  Good  Language:  Good  Akathisia:  No  Handed:  Right  AIMS (if indicated):     Assets:  Communication Skills Desire for Improvement Financial Resources/Insurance Housing Physical Health Social Support Transportation  ADL's:  Intact  Cognition:  WNL  Sleep:  Number of Hours: 4.75   Problems Addressed: Social anxiety disorder MDD severe Adjustment disorder  Treatment Plan Summary: Daily contact with patient to assess and evaluate symptoms and progress in treatment, Medication management and Plan is to:  -Increase Luvox 100 mg PO Daily for mood stability -Start Seroquel 25 mg PO BID for mood stability -Continue Trazodone 50 mg PO QHS PRN for insomnia -Encourage group therapy participation  Maryfrances Bunnellravis B Money, FNP 08/22/2017, 3:07 PM   Agree with NP Progress Note

## 2017-08-22 NOTE — Progress Notes (Signed)
Recreation Therapy Notes  Animal-Assisted Activity (AAA) Program Checklist/Progress Notes Patient Eligibility Criteria Checklist & Daily Group note for Rec TxIntervention  Date: 11.27.2018 Time: 2:45pm Location: Adult Unit  AAA/T Program Assumption of Risk Form signed by Patient/ or Parent Legal Guardian Yes  Patient is free of allergies or sever asthma Yes  Patient reports no fear of animals Yes  Patient reports no history of cruelty to animals Yes  Patient understands his/her participation is voluntary Yes  Behavioral Response: Did not attend.   Vincent Valenzuela, LRT/CTRS        Vincent Valenzuela 08/22/2017 3:33 PM 

## 2017-08-22 NOTE — [facility] Group Notes (Signed)
Pt attended spiritual care group on grief and loss facilitated by chaplain Burnis KingfisherMatthew Kari Kerth   Group opened with brief discussion and psycho-social ed around grief and loss in relationships and in relation to self - identifying life patterns, circumstances, changes that cause losses. Established group norm of speaking from own life experience. Group goal of establishing open and affirming space for members to share loss and experience with grief, normalize grief experience and provide psycho social education and grief support.   Iantha FallenKenneth was present throughout group.  Appropriate affect, attentive to group conversation evidenced by body language, head nods and eye contact with facilitator and speakers.  Did not engage in group conversation.   [facility] / [facility] Chaplain Burnis KingfisherMatthew Bakari Nikolai, MDiv

## 2017-08-23 DIAGNOSIS — Z818 Family history of other mental and behavioral disorders: Secondary | ICD-10-CM | POA: Diagnosis not present

## 2017-08-23 DIAGNOSIS — Z811 Family history of alcohol abuse and dependence: Secondary | ICD-10-CM | POA: Diagnosis not present

## 2017-08-23 DIAGNOSIS — F332 Major depressive disorder, recurrent severe without psychotic features: Secondary | ICD-10-CM | POA: Diagnosis not present

## 2017-08-23 DIAGNOSIS — F4325 Adjustment disorder with mixed disturbance of emotions and conduct: Secondary | ICD-10-CM | POA: Diagnosis not present

## 2017-08-23 NOTE — Plan of Care (Signed)
Pt safe on the unit at this time 

## 2017-08-23 NOTE — Progress Notes (Signed)
D-pt seems to keep to himself and seems depressed A-pt took shower tonight, pt attended group tonight, pt took his pm medications R-cont to monitor for safety

## 2017-08-23 NOTE — Progress Notes (Signed)
Recreation Therapy Notes  Date: 08/23/17 Time: 0930 Location: [address] Dayroom  Group Topic: Stress Management  Goal Area(s) Addresses:  Patient will verbalize importance of using healthy stress management.  Patient will identify positive emotions associated with healthy stress management.   Behavioral Response: Engaged  Intervention: Stress Management  Activity :  Mountain Meditation.  LRT introduced the stress management technique of meditation.  Patients were to listen and follow along as LRT played meditation from the Calm app.  Education:  Stress Management, Discharge Planning.   Education Outcome: Acknowledges edcuation/In group clarification offered/Needs additional education  Clinical Observations/Feedback: Pt attended group.    Vincent Valenzuela, LRT/CTRS         Vincent Valenzuela 08/23/2017 12:49 PM 

## 2017-08-23 NOTE — Progress Notes (Signed)
[facility] MD Progress Note  08/23/2017 2:09 PM Vincent Valenzuela  MRN:  [id number]   Subjective:  Patient reports he is feeling better than he did on admission and as he improves he is focusing more on discharge planning and is looking forward to discharging soon. Denies medication side effects.  Reports he has no had any angry outbursts , and feels that medications are helping him feel calmer and less easily agitated, nervous or angry. Denies suicidal ideations.  Objective: I have discussed case with treatment team and have met with patient . Patient reports he is feeling better and as above, he is currently focused on being discharged soon. Her reports history of some depression, anxiety, and brief episodes of angry outbursts, which had already improved recently after he stopped drinking alcohol a few weeks ago ( reports heavy daily alcohol consumption in the past ).  Relationship stressors with GF were a contributor to worsening symptoms, but states he is less concerned and less focused on relationship issues and " working on myself " at present. Denies suicidal or self injurious ideations, also denies any homicidal or violent ideations. Denies medication side effects. Behavior on unit in good control, going to some groups, visible on unit . Of note, reports vague dysesthesia on R 5th finger after playing basketball 1-2 days ago. No other symptom- no lateralization or weakness noted.    Principal Problem: Major depressive disorder, recurrent severe without psychotic features ([place]) Diagnosis:   Patient Active Problem List   Diagnosis Date Noted  . Adjustment disorder with mixed disturbance of emotions and conduct [F43.25] 08/21/2017  . Cannabis use disorder, moderate, dependence ([place]) [F12.20] 07/07/2017  . Major depressive disorder, recurrent severe without psychotic features ([place]) [F33.2] 07/07/2017  . Overdose [T50.901A] 07/07/2017  . PTSD (post-traumatic stress disorder) [F43.10] 07/07/2017  .  OCD (obsessive compulsive disorder) [F42.9] 07/07/2017  . Strain of left trapezius muscle [S46.812A] 06/26/2015  . Rotator cuff syndrome [M75.100] 03/12/2014  . Back pain [M54.9] 10/04/2013  . Social anxiety disorder [F40.10] 06/14/2013   Total Time spent with patient: 20 minutes  Past Psychiatric History: See H&P  Past Medical History:  Past Medical History:  Diagnosis Date  . Allergy   . Anxiety   . GERD (gastroesophageal reflux disease)   . Headaches, cluster    History reviewed. No pertinent surgical history. Family History:  Family History  Problem Relation Age of Onset  . Lupus Mother   . Depression Mother   . Depression Father   . Alcohol abuse Father   . Hypertension Father   . Lupus Maternal Grandmother   . Diabetes Maternal Grandmother   . Hypertension Maternal Grandmother   . Hyperlipidemia Maternal Grandmother   . Diabetes Maternal Grandfather   . Hypertension Maternal Grandfather   . Hyperlipidemia Maternal Grandfather   . Diabetes Paternal Grandmother   . Hypertension Paternal Grandmother   . Hyperlipidemia Paternal Grandmother   . Diabetes Paternal Grandfather   . Hypertension Paternal Grandfather   . Hyperlipidemia Paternal Grandfather    Family Psychiatric  History: See H&P Social History:  Social History   Substance and Sexual Activity  Alcohol Use No     Social History   Substance and Sexual Activity  Drug Use No    Social History   Socioeconomic History  . Marital status: Single    Spouse name: None  . Number of children: None  . Years of education: None  . Highest education level: None  Social Needs  . Financial  resource strain: None  . Food insecurity - worry: None  . Food insecurity - inability: None  . Transportation needs - medical: None  . Transportation needs - non-medical: None  Occupational History  . None  Tobacco Use  . Smoking status: Passive Smoke Exposure - Never Smoker  . Smokeless tobacco: Never Used  Substance  and Sexual Activity  . Alcohol use: No  . Drug use: No  . Sexual activity: None  Other Topics Concern  . None  Social History Narrative  . None   Additional Social History:   Sleep: Good  Appetite:  Good  Current Medications: Current Facility-Administered Medications  Medication Dose Route Frequency Provider Last Rate Last Dose  . acetaminophen (TYLENOL) tablet 650 mg  650 mg Oral Q6H PRN Patrecia Pour, NP   650 mg at 08/22/17 0935  . alum & mag hydroxide-simeth (MAALOX/MYLANTA) 200-200-20 MG/5ML suspension 30 mL  30 mL Oral Q4H PRN Patrecia Pour, NP      . fluvoxaMINE (LUVOX) tablet 100 mg  100 mg Oral QHS Money, Travis B, FNP   100 mg at 08/22/17 2301  . magnesium hydroxide (MILK OF MAGNESIA) suspension 30 mL  30 mL Oral Daily PRN Patrecia Pour, NP      . QUEtiapine (SEROQUEL) tablet 25 mg  25 mg Oral BID Money, Lowry Ram, FNP   25 mg at 08/23/17 0826  . traZODone (DESYREL) tablet 50 mg  50 mg Oral QHS,MR X 1 Lindon Romp A, NP   50 mg at 08/22/17 2301    Lab Results: No results found for this or any previous visit (from the past 54 hour(s)).  Blood Alcohol level:  Lab Results  Component Value Date   ETH <10 08/20/2017   ETH 18 (H) 92/33/0076    Metabolic Disorder Labs: No results found for: HGBA1C, MPG No results found for: PROLACTIN No results found for: CHOL, TRIG, HDL, CHOLHDL, VLDL, LDLCALC  Physical Findings: AIMS: Facial and Oral Movements Muscles of Facial Expression: None, normal Lips and Perioral Area: None, normal Jaw: None, normal Tongue: None, normal,Extremity Movements Upper (arms, wrists, hands, fingers): None, normal Lower (legs, knees, ankles, toes): None, normal, Trunk Movements Neck, shoulders, hips: None, normal, Overall Severity Severity of abnormal movements (highest score from questions above): None, normal Incapacitation due to abnormal movements: None, normal Patient's awareness of abnormal movements (rate only patient's report): No  Awareness, Dental Status Current problems with teeth and/or dentures?: No Does patient usually wear dentures?: No  CIWA:    COWS:     Musculoskeletal: Strength & Muscle Tone: within normal limits Gait & Station: normal Patient leans: N/A  Psychiatric Specialty Exam: Physical Exam  Nursing note and vitals reviewed. Constitutional: He is oriented to person, place, and time. He appears well-developed and well-nourished.  Cardiovascular: Normal rate.  Respiratory: Effort normal.  Musculoskeletal: Normal range of motion.  Neurological: He is alert and oriented to person, place, and time.  Skin: Skin is warm.    Review of Systems  Constitutional: Negative.   HENT: Negative.   Eyes: Negative.   Respiratory: Negative.   Cardiovascular: Negative.   Gastrointestinal: Negative.   Genitourinary: Negative.   Musculoskeletal: Negative.   Skin: Negative.   Neurological: Negative.   Endo/Heme/Allergies: Negative.   Psychiatric/Behavioral: The patient is nervous/anxious.   no headache, no chest pain, no shortness of breath, no vomiting   Blood pressure (!) 152/80, pulse 61, temperature 98.3 F (36.8 C), temperature source Oral, resp. rate 18, height 5'  9" (1.753 m), weight 106.6 kg (235 lb).Body mass index is 34.7 kg/m.  General Appearance: Fairly Groomed  Eye Contact:  Good  Speech:  Normal Rate  Volume:  Normal  Mood:  partially improved, feels better   Affect:  Appropriate and anxious   Thought Process:  Goal Directed and Descriptions of Associations: Intact  Orientation:  Full (Time, Place, and Person)  Thought Content:  no hallucinations, no delusions, not internally preoccupied   Suicidal Thoughts:  No denies suicidal or self injurious ideations, denies homicidal or violent ideations , specifically also denies any HI towards SO   Homicidal Thoughts:  No  Memory:  recent and remote grossly intact   Judgement:  Good  Insight:  Good  Psychomotor Activity:  Normal   Concentration:  Concentration: Good and Attention Span: Good  Recall:  Good  Fund of Knowledge:  Good  Language:  Good  Akathisia:  No  Handed:  Right  AIMS (if indicated):     Assets:  Communication Skills Desire for Improvement Financial Resources/Insurance Housing Physical Health Social Support Transportation  ADL's:  Intact  Cognition:  WNL  Sleep:  Number of Hours: 5   Assessment- patient reports partial improvement of mood and affect . Denies SI. Denies HI. Reports history of explosive, angry outbursts but presents calm, polite, behavior in good control on unit. He states alcohol was a contributor to these episodes and he stopped drinking a few weeks prior. Currently tolerating Luvox and low dose Seroquel well - side effects reviewed   Treatment Plan Summary: Treatment Plan reviewed as below today 11/28  Daily contact with patient to assess and evaluate symptoms and progress in treatment, Medication management and Plan is to:  -Continue  Luvox 100 mg PO Daily for depression, anxiety  -Continue  Seroquel 25 mg PO BID for mood disorder, anxiety  -Continue Trazodone 50 mg PO QHS PRN for insomnia as needed  -Encourage group therapy participation to work on Radiographer, therapeutic and symptom reduction -Treatment team working on disposition planning options  Jenne Campus, MD 08/23/2017, 2:09 PMPatient ID: Vincent Valenzuela, male   DOB: 08-31-1996, 21 y.o.   MRN: [id number]

## 2017-08-23 NOTE — Progress Notes (Signed)
D: Pt presents with a flat affect. Pt reports decreased depression and anxiety today. Pt denies any agitation or anger episodes. Pt stating that he's tolerating his meds well and denies any side effects. Pt verbalized that his mother came to visit yesterday and expressed to him that she's proud of him for getting help. Pt denies SI. Pt reports improved sleep last night. Pt reports a fair appetite today. No inappropriate behaviors observed by pt. A: Medications reviewed with pt. Medications administered as ordered per MD. Verbal support provided. Pt encouraged to attend groups. 15 minute checks performed for safety. R: Pt compliant with tx.

## 2017-08-24 ENCOUNTER — Ambulatory Visit: Payer: No Typology Code available for payment source | Admitting: Family Medicine

## 2017-08-24 DIAGNOSIS — Z818 Family history of other mental and behavioral disorders: Secondary | ICD-10-CM | POA: Diagnosis not present

## 2017-08-24 DIAGNOSIS — Z811 Family history of alcohol abuse and dependence: Secondary | ICD-10-CM | POA: Diagnosis not present

## 2017-08-24 DIAGNOSIS — F4325 Adjustment disorder with mixed disturbance of emotions and conduct: Secondary | ICD-10-CM | POA: Diagnosis not present

## 2017-08-24 DIAGNOSIS — F332 Major depressive disorder, recurrent severe without psychotic features: Secondary | ICD-10-CM | POA: Diagnosis not present

## 2017-08-24 MED ORDER — TRAZODONE HCL 50 MG PO TABS: 50.0000 mg | ORAL_TABLET | Freq: Every evening | ORAL | 0 refills | Status: AC | PRN

## 2017-08-24 MED ORDER — FLUVOXAMINE MALEATE 100 MG PO TABS: 100.0000 mg | ORAL_TABLET | Freq: Every day | ORAL | 0 refills | Status: DC

## 2017-08-24 MED ORDER — QUETIAPINE FUMARATE 25 MG PO TABS: 25.0000 mg | ORAL_TABLET | Freq: Two times a day (BID) | ORAL | 0 refills | Status: DC

## 2017-08-24 MED ORDER — TRAZODONE HCL 50 MG PO TABS
50.0000 mg | ORAL_TABLET | Freq: Every evening | ORAL | Status: DC | PRN
  Filled 2017-08-24: qty 7

## 2017-08-24 NOTE — Discharge Summary (Signed)
Physician Discharge Summary Note  Patient:  Vincent Valenzuela is an 21 y.o., male MRN:  [id number] DOB:  1996/04/07 Patient phone:  [phone] (home)  Patient address:   [address] [place] [address],  Total Time spent with patient: 20 minutes  Date of Admission:  08/20/2017 Date of Discharge: 08/24/17   Reason for Admission:  Worsening depression with SI  Principal Problem: Major depressive disorder, recurrent severe without psychotic features [facility]) Discharge Diagnoses: Patient Active Problem List   Diagnosis Date Noted  . Adjustment disorder with mixed disturbance of emotions and conduct [F43.25] 08/21/2017  . Cannabis use disorder, moderate, dependence (HCC) [F12.20] 07/07/2017  . Major depressive disorder, recurrent severe without psychotic features (HCC) [F33.2] 07/07/2017  . Overdose [T50.901A] 07/07/2017  . PTSD (post-traumatic stress disorder) [F43.10] 07/07/2017  . OCD (obsessive compulsive disorder) [F42.9] 07/07/2017  . Strain of left trapezius muscle [S46.812A] 06/26/2015  . Rotator cuff syndrome [M75.100] 03/12/2014  . Back pain [M54.9] 10/04/2013  . Social anxiety disorder [F40.10] 06/14/2013    Past Psychiatric History: Single admission in the past. Was hospitalized at [facility] in October 2018. He overdosed at hat time. He denies any other suicidal behavior. He had some benefit from Citalopram in the past. He is currently on Fluvoxamine. No past history of mania. No past history of psychosis  Past Medical History:  Past Medical History:  Diagnosis Date  . Allergy   . Anxiety   . GERD (gastroesophageal reflux disease)   . Headaches, cluster    History reviewed. No pertinent surgical history. Family History:  Family History  Problem Relation Age of Onset  . Lupus Mother   . Depression Mother   . Depression Father   . Alcohol abuse Father   . Hypertension Father   . Lupus Maternal Grandmother   . Diabetes Maternal Grandmother   . Hypertension  Maternal Grandmother   . Hyperlipidemia Maternal Grandmother   . Diabetes Maternal Grandfather   . Hypertension Maternal Grandfather   . Hyperlipidemia Maternal Grandfather   . Diabetes Paternal Grandmother   . Hypertension Paternal Grandmother   . Hyperlipidemia Paternal Grandmother   . Diabetes Paternal Grandfather   . Hypertension Paternal Grandfather   . Hyperlipidemia Paternal Grandfather    Family Psychiatric  History: Father has depression and SUD. Mother suffers from OCD  Social History:  Social History   Substance and Sexual Activity  Alcohol Use No     Social History   Substance and Sexual Activity  Drug Use No    Social History   Socioeconomic History  . Marital status: Single    Spouse name: None  . Number of children: None  . Years of education: None  . Highest education level: None  Social Needs  . Financial resource strain: None  . Food insecurity - worry: None  . Food insecurity - inability: None  . Transportation needs - medical: None  . Transportation needs - non-medical: None  Occupational History  . None  Tobacco Use  . Smoking status: Passive Smoke Exposure - Never Smoker  . Smokeless tobacco: Never Used  Substance and Sexual Activity  . Alcohol use: No  . Drug use: No  . Sexual activity: None  Other Topics Concern  . None  Social History Narrative  . None    Hospital Course:   08/21/17 [facility] MD Assessment: 21 y.o Caucasian male, single, unemployed, lives with his mother. Background history of SUD and Mood disorder. Presented to the ER involuntarily via the police. His  mother committed him after he expressed suicidal thoughts. He was reported to have been raging at home. He threatened him mom and girlfriend . Main stressors includes recent breakup from his girlfriend and legal issues.  Routine labs is essentially normal, toxicology is negative,  UDS is positive for THC , BAL<10 mg/dl. At interview, patient tells me that he was upset because  his girlfriend has been cheating and he just found out. Says she has decided to end their relationship. Patient says usually he would turn to alcohol as a way to cope. Says he had been sober for over two weeks. Patient tells me that he took out his frustration in his moms house. Says his mom got scared and asked him and called the police. Patient states that the police initially took him to a hotel. Patient says he had no money to pay for a room. Says he was out in the cold and felt desperate. Says he then called his mother and expressed suicidal thoughts. Patient says that he did it to gain attention. Says he was desperate to get in somewhere. Patient denies any thoughts of suicide. He denies any homicidal thoughts. Says he just wants to get back on his medications. No evidence of psychosis. No evidence of mania. Says he was doing well until he found out about the cheating. Patient states that he is stressed by not having any income. Says he is due in court next month for probation violation.  Patient denies any access to weapons. He has agreed to recommence Fluvoxamine.   [facility] and stabilized with medications and therapy. Patient was restarted on Luvox and titrated to 100 mg QHS. He was started on Seroquel 25 mg BID. Patient showed significant improvement with improved sleep, mood, affect, appetite, and interaction. He had been seen in the day room interacting with peers and staff appropriately. He has been attending groups and participating. Patient denies any SI/HI/AVH and contracts for safety. Patient is provided with prescriptions and samples for his medications upon discharge. Patient agrees to [facility] at [facility].     Physical Findings: AIMS: Facial and Oral Movements Muscles of Facial Expression: None, normal Lips and Perioral Area: None, normal Jaw: None, normal Tongue: None, normal,Extremity Movements Upper (arms, wrists, hands,  fingers): None, normal Lower (legs, knees, ankles, toes): None, normal, Trunk Movements Neck, shoulders, hips: None, normal, Overall Severity Severity of abnormal movements (highest score from questions above): None, normal Incapacitation due to abnormal movements: None, normal Patient's awareness of abnormal movements (rate only patient's report): No Awareness, Dental Status Current problems with teeth and/or dentures?: No Does patient usually wear dentures?: No  CIWA:    COWS:     Musculoskeletal: Strength & Muscle Tone: within normal limits Gait & Station: normal Patient leans: N/A  Psychiatric Specialty Exam: Physical Exam  Nursing note and vitals reviewed. Constitutional: He is oriented to person, place, and time. He appears well-developed and well-nourished.  Cardiovascular: Normal rate.  Respiratory: Effort normal.  Musculoskeletal: Normal range of motion.  Neurological: He is alert and oriented to person, place, and time.  Skin: Skin is warm.    Review of Systems  Constitutional: Negative.   HENT: Negative.   Eyes: Negative.   Respiratory: Negative.   Cardiovascular: Negative.   Gastrointestinal: Negative.   Genitourinary: Negative.   Musculoskeletal: Negative.   Skin: Negative.   Neurological: Negative.   Endo/Heme/Allergies: Negative.   Psychiatric/Behavioral: Negative.     Blood  pressure 122/79, pulse 86, temperature 98.6 F (37 C), temperature source Oral, resp. rate 18, height 5\' 9"  (1.753 m), weight 106.6 kg (235 lb).Body mass index is 34.7 kg/m.  General Appearance: Casual  Eye Contact:  Good  Speech:  Clear and Coherent and Normal Rate  Volume:  Normal  Mood:  Euthymic  Affect:  Appropriate  Thought Process:  Goal Directed and Descriptions of Associations: Intact  Orientation:  Full (Time, Place, and Person)  Thought Content:  WDL  Suicidal Thoughts:  No  Homicidal Thoughts:  No  Memory:  Immediate;   Good Recent;   Good Remote;   Good   Judgement:  Good  Insight:  Good  Psychomotor Activity:  Normal  Concentration:  Concentration: Good and Attention Span: Good  Recall:  Good  Fund of Knowledge:  Good  Language:  Good  Akathisia:  No  Handed:  Right  AIMS (if indicated):     Assets:  Communication Skills Desire for Improvement Financial Resources/Insurance Housing Physical Health Social Support Transportation  ADL's:  Intact  Cognition:  WNL  Sleep:  Number of Hours: 6     Have you used any form of tobacco in the last 30 days? (Cigarettes, Smokeless Tobacco, Cigars, and/or Pipes): No  Has this patient used any form of tobacco in the last 30 days? (Cigarettes, Smokeless Tobacco, Cigars, and/or Pipes) Yes, No  Blood Alcohol level:  Lab Results  Component Value Date   ETH <10 08/20/2017   ETH 18 (H) 07/05/2017    Metabolic Disorder Labs:  No results found for: HGBA1C, MPG No results found for: PROLACTIN No results found for: CHOL, TRIG, HDL, CHOLHDL, VLDL, LDLCALC  See Psychiatric Specialty Exam and Suicide Risk Assessment completed by Attending Physician prior to discharge.  Discharge destination:  Home  Is patient on multiple antipsychotic therapies at discharge:  No   Has Patient had three or more failed trials of antipsychotic monotherapy by history:  No  Recommended Plan for Multiple Antipsychotic Therapies: NA   Allergies as of 08/24/2017   No Known Allergies     Medication List    STOP taking these medications   acetaminophen 500 MG tablet Commonly known as:  TYLENOL     TAKE these medications     Indication  fluvoxaMINE 100 MG tablet Commonly known as:  LUVOX Take 1 tablet (100 mg total) by mouth at bedtime. For mood control What changed:  additional instructions  Indication:  mood stability   QUEtiapine 25 MG tablet Commonly known as:  SEROQUEL Take 1 tablet (25 mg total) by mouth 2 (two) times daily. For mood control  Indication:  mood stability   traZODone 50 MG  tablet Commonly known as:  DESYREL Take 1 tablet (50 mg total) by mouth at bedtime as needed for sleep.  Indication:  Trouble Sleeping      Follow-up Information    [facility]c, [facility] Follow up.   Why:  Follow up should be done within 3 days after discharge from the hospital.  This is a walk-in clinic that you can go to from 8am to 5pm, Monday thru Friday.  Please bring your ID and any insurance information that you have.   Contact information: [address] [place] [address] [phone]           Follow-up recommendations:  Continue activity as tolerated. Continue diet as recommended by your PCP. Ensure to keep all appointments with outpatient providers.  Comments:  Patient is instructed prior to  discharge to: Take all medications as prescribed by his/her mental healthcare provider. Report any adverse effects and or reactions from the medicines to his/her outpatient provider promptly. Patient has been instructed & cautioned: To not engage in alcohol and or illegal drug use while on prescription medicines. In the event of worsening symptoms, patient is instructed to call the crisis hotline, 911 and or go to the nearest ED for appropriate evaluation and treatment of symptoms. To follow-up with his/her primary care provider for your other medical issues, concerns and or health care needs.    Signed: Gerlene Burdock Money, FNP 08/24/2017, 10:00 AM   Patient seen, Suicide Assessment Completed.  Disposition Plan Reviewed

## 2017-08-24 NOTE — BHH Suicide Risk Assessment (Signed)
[facility]   Principal Problem: Major depressive disorder, recurrent severe without psychotic features [facility](HCC) Discharge Diagnoses:  Patient Active Problem List   Diagnosis Date Noted  . Adjustment disorder with mixed disturbance of emotions and conduct [F43.25] 08/21/2017  . Cannabis use disorder, moderate, dependence (HCC) [F12.20] 07/07/2017  . Major depressive disorder, recurrent severe without psychotic features (HCC) [F33.2] 07/07/2017  . Overdose [T50.901A] 07/07/2017  . PTSD (post-traumatic stress disorder) [F43.10] 07/07/2017  . OCD (obsessive compulsive disorder) [F42.9] 07/07/2017  . Strain of left trapezius muscle [S46.812A] 06/26/2015  . Rotator cuff syndrome [M75.100] 03/12/2014  . Back pain [M54.9] 10/04/2013  . Social anxiety disorder [F40.10] 06/14/2013    Total Time spent with patient: 30 minutes  Musculoskeletal: Strength & Muscle Tone: within normal limits Gait & Station: normal Patient leans: N/A  Psychiatric Specialty Exam: ROS no headache, no chest pain, no shortness of breath, no vomiting   Blood pressure 122/79, pulse 86, temperature 98.6 F (37 C), temperature source Oral, resp. rate 18, height 5\' 9"  (1.753 m), weight 106.6 kg (235 lb).Body mass index is 34.7 kg/m.  General Appearance: improved grooming   Eye Contact::  Good  Speech:  Normal Rate409  Volume:  Normal  Mood:  denies feeling depressed at this time, and presents euthymic   Affect:  Appropriate and Full Range  Thought Process:  Linear and Descriptions of Associations: Intact  Orientation:  Full (Time, Place, and Person)  Thought Content:  denies hallucinations, no delusions, not internally preoccupied   Suicidal Thoughts:  No denies any suicidal or self injurious ideations, denies any homicidal or violent ideations and specifically also denies any HI towards ex GF   Homicidal Thoughts:  No  Memory:  recent and remote grossly intact   Judgement:  Other:   improving   Insight:  Present  Psychomotor Activity:  Normal  Concentration:  Good  Recall:  Good  Fund of Knowledge:Good  Language: Good  Akathisia:  Negative  Handed:  Right  AIMS (if indicated):     Assets:  Communication Skills Desire for Improvement Resilience  Sleep:  Number of Hours: 6  Cognition: WNL  ADL's:  Intact   Mental Status Per Nursing Assessment::   On Admission:  Suicidal ideation indicated by patient, Suicide plan, Plan includes specific time, place, or method, Self-harm thoughts, Intention to act on suicide plan  Demographic Factors:  Single, no children, currently unemployed    Loss Factors: Recent break up, unemployment   Historical Factors: One prior psychiatric admission in October/2018. History of depression, Social Anxiety symptoms. No prior suicidal attempts   Risk Reduction Factors:   Sense of responsibility to family and Positive coping skills or problem solving skills  Continued Clinical Symptoms:  At this time patient is alert, attentive, calm, pleasant, reports mood is improved and presents euthymic, with a fully reactive, brighter affect, no thought disorder, no suicidal or self injurious ideations, no homicidal or violent ideations, no hallucinations, no delusions, not internally preoccupied, future oriented . Denies medication side effects.  Behavior on unit in good control, pleasant on approach.  Cognitive Features That Contribute To Risk:  No gross cognitive deficits noted upon discharge. Is alert , attentive, and oriented x 3    Suicide Risk:  Mild:  Suicidal ideation of limited frequency, intensity, duration, and specificity.  There are no identifiable plans, no associated intent, mild dysphoria and related symptoms, good self-control (both objective and subjective assessment), few other risk factors, and identifiable protective factors, including  available and accessible social support.  Follow-up Information    [facility] Follow up.    Contact information: [address] [place] [address] [phone]           Plan Of Care/Follow-up recommendations:  Activity:  as tolerated Diet:  regular Tests:  NA Other:  see below Patient is expressing readiness for discharge and is leaving unit in good spirits Plans to return home, states father is picking him up later today Follow up as above  Craige CottaFernando A Dorean Hiebert, MD 08/24/2017, 12:10 PM

## 2017-08-24 NOTE — Progress Notes (Signed)
  [facility] :  Will you be returning to the same living situation after discharge:  Yes,  home At discharge, do you have transportation home?: Yes,  family friend Do you have the ability to pay for your medications: Yes,  mental health  Release of information consent forms completed and in the chart;  Patient's signature needed at discharge.  Patient to Follow up at: Follow-up Information    [facility] Follow up.   Contact information: [address] [place] [address] [phone]           Next level of care provider has access to [facility] Link:no  Safety Planning and Suicide Prevention discussed: Yes,  yes  Have you used any form of tobacco in the last 30 days? (Cigarettes, Smokeless Tobacco, Cigars, and/or Pipes): No  Has patient been referred to the Quitline?: N/A patient is not a smoker  Patient has been referred for addiction treatment: Pt. refused referral  Ida RogueRodney B Lama Narayanan, LCSW 08/24/2017, 10:30 AM

## 2017-08-24 NOTE — BHH Counselor (Signed)
[facility]

## 2017-08-24 NOTE — Progress Notes (Signed)
Pt d/c from the hospital. All items returned. D/C instructions given, prescriptions given and samples given. Pt denies si and hi. 

## 2017-10-02 ENCOUNTER — Other Ambulatory Visit: Payer: Self-pay | Admitting: *Deleted

## 2017-10-03 NOTE — Telephone Encounter (Signed)
Mother called about sons seroquel Rx.  She was told at the pharmacy it had been denied because he needed an appt. He has an appt 10-09-17.  He will run out of the medication tomorrow. Could he get enough pills to make it to Monday?  Please advise

## 2017-10-04 MED ORDER — QUETIAPINE FUMARATE 25 MG PO TABS: 25.0000 mg | ORAL_TABLET | Freq: Two times a day (BID) | ORAL | 0 refills | Status: DC

## 2017-10-09 ENCOUNTER — Ambulatory Visit: Payer: Self-pay | Admitting: Family Medicine

## 2017-10-25 ENCOUNTER — Ambulatory Visit (INDEPENDENT_AMBULATORY_CARE_PROVIDER_SITE_OTHER): Payer: Self-pay | Admitting: Family Medicine

## 2017-10-25 ENCOUNTER — Other Ambulatory Visit: Payer: Self-pay

## 2017-10-25 ENCOUNTER — Encounter: Payer: Self-pay | Admitting: Family Medicine

## 2017-10-25 ENCOUNTER — Encounter: Payer: Self-pay | Admitting: Licensed Clinical Social Worker

## 2017-10-25 VITALS — BP 106/68 | HR 64 | Temp 98.8°F | Ht 72.0 in | Wt 235.6 lb

## 2017-10-25 DIAGNOSIS — R1011 Right upper quadrant pain: Secondary | ICD-10-CM | POA: Insufficient documentation

## 2017-10-25 DIAGNOSIS — F332 Major depressive disorder, recurrent severe without psychotic features: Secondary | ICD-10-CM

## 2017-10-25 DIAGNOSIS — F4010 Social phobia, unspecified: Secondary | ICD-10-CM

## 2017-10-25 MED ORDER — BUSPIRONE HCL 7.5 MG PO TABS: 7.5000 mg | ORAL_TABLET | Freq: Two times a day (BID) | ORAL | 0 refills | Status: DC

## 2017-10-25 MED ORDER — FLUVOXAMINE MALEATE 100 MG PO TABS: 100.0000 mg | ORAL_TABLET | Freq: Every day | ORAL | 0 refills | Status: DC

## 2017-10-25 NOTE — Progress Notes (Signed)
ESTIMATE TIME:20 minutes Type of Service: Integrated Behavioral Health warm handoff  Interpreter:No.   SUBJECTIVE: Vincent Valenzuela is a 22 y.o. male referred by Dr. Karen ChafeLockamy for: symptoms of  anxiety and depression a well as getting connected to community resources.  Patient is pleasant, engaged in conversation, presents with flat affact , was accompanied by his mother whom he looked to for help in answering questions. Patient reports : [facility]. Previous therapy 2 or 3 sessions prior to inpatient treatment, patient states was not helpful.  Patient reports [facility] felt staff was rude and did not listern to him.    Patient is currently seeing a therapist [facility], mom found on [employer]. Patient and mom believe patient made a great connection with [name] , he enjoys going every Sat. Has been about 2 to 3 times and feels he is making progress.  Barrier to this therapeutic relationship, patient has no insurance and mom cannot afford to continue paying $125.00 per visit.  Mom and patient both agree that patient will do better if he does not have to change therapist.   GOALS: Patient will reduce symptoms of: anxiety and depression, and increase  ability WU:[id number]:coping skills and self-management skills, .  INTERVENTION:  Solution-Focused Strategies,, Behavioral Therapy (Relaxed breathing), Supportive Counseling    PHQ 9=10,indication of : moderate depression. GAD-7=15,indication of : severe anxiety.  Depression screen PHQ 2/9 10/25/2017  Decreased Interest 2  Down, Depressed, Hopeless 2  PHQ - 2 Score 4  Altered sleeping 1  Tired, decreased energy 3  Change in appetite 0  Feeling bad or failure about yourself  2  Trouble concentrating 0  Moving slowly or fidgety/restless 0  Suicidal thoughts 0  PHQ-9 Score 10   GAD 7 : Generalized Anxiety Score 10/25/2017  Nervous, Anxious, on Edge 2   Control/stop worrying 2  Worry too much - different things 2  Trouble relaxing 3  Restless 1  Easily annoyed or irritable 3  Afraid - awful might happen 2  Total GAD 7 Score 15  Anxiety Difficulty Somewhat difficult   ISSUES DISCUSSED: Integrated care services, support system, previous and current coping skills, community resources , barriers to treatment and Medicaid.  LCSW provided resources for free and low cost therapy as an option if patient cannot continue with current therapist.    ASSESSMENT:Patient currently experiencing symptoms of depression and anxiety. Patient appears to be making progress with his current therapist however he has no insurance and his mother is unable to afford the weekly cost of $125.00.  [facility].    Patient may benefit from, and is in agreement to continue seeing his current therapist and explore all options to continue this relationship.  Patient will consider resources provided by LCSW or return for F/U appointment with LCSW for brief therapeutic interventions to assist with managing his symptoms if he is unable to continue with [name] for ongoing services.  PLAN:   1. LCSW will F/U with phone call 7 to 10 days [phone]  2. Behavioral recommendations: relaxed breathing  3. Referral:continue with Therapist [facility], [facility]  Warm Hand Off Completed.     Vincent Hineseborah Ariyanah Aguado, LCSW Licensed Clinical Social Worker [facility]   [phone] 11:56 AM

## 2017-10-25 NOTE — Progress Notes (Signed)
Subjective: Chief Complaint  Patient presents with  . GI Problem     HPI: Vincent Valenzuela is a 22 y.o. presenting to clinic today to discuss the following:  Depression with an episode of suicidal ideation requiring hospital admission: Patient states he is feeling much better and denies any suicidal ideation or intention to hurt himself or others. He states he has his medications but will need refills for them. He has outpatient Psych follow up with [facility]   Right Sided Abdominal Pain: Patient endorses abdominal pain after eating. Lasts for 3930min-1 hr and then subsides. Seems to be brought on by fatty foods and eggs. Patient has difficulty describing pain but says usually it is "crampy" pain but sometimes sharp. This has been ongoing for "many months" and he typically just avoids these foods and he does not have the pain.  Social Anxiety Disorder: patient is endorsing a few years of social anxiety that is effecting his ability to do simple tasks like run errands, etc. Patient has not taken anything for anxiety on a regular basis.  Review of Systems  Constitutional: Negative for chills, fever and weight loss.  HENT: Negative for congestion, sinus pain and sore throat.   Respiratory: Negative for cough, hemoptysis, shortness of breath and wheezing.   Cardiovascular: Negative for chest pain and palpitations.  Gastrointestinal: Positive for abdominal pain and nausea. Negative for blood in stool, constipation, diarrhea and vomiting.  Genitourinary: Negative for dysuria, frequency and urgency.  Musculoskeletal: Negative for myalgias.  Skin: Negative for itching and rash.  Psychiatric/Behavioral: Negative for hallucinations and suicidal ideas. The patient is nervous/anxious.    Health Maintenance: none at this visit     ROS noted in HPI.   Past Medical, Surgical, Social, and Family History Reviewed & Updated per EMR.   Pertinent Historical Findings include:   Social History    Tobacco Use  Smoking Status Passive Smoke Exposure - Never Smoker  Smokeless Tobacco Never Used      Objective: BP 106/68   Pulse 64   Temp 98.8 F (37.1 C) (Oral)   Ht 6' (1.829 m)   Wt 235 lb 9.6 oz (106.9 kg)   SpO2 97%   BMI 31.95 kg/m  Vitals and nursing notes reviewed  Physical Exam  Constitutional: He is oriented to person, place, and time and well-developed, well-nourished, and in no distress. No distress.  HENT:  Head: Normocephalic and atraumatic.  Eyes: Conjunctivae and EOM are normal. Pupils are equal, round, and reactive to light.  Cardiovascular: Normal rate, regular rhythm, normal heart sounds and intact distal pulses.  No murmur heard. Pulmonary/Chest: Effort normal and breath sounds normal. He has no wheezes. He has no rales.  Abdominal: Soft. Bowel sounds are normal. He exhibits no distension. There is tenderness. There is no rebound and no guarding.  Right Upper Quadrant TTP; Murphy sign negative  Musculoskeletal: Normal range of motion. He exhibits no edema.  Neurological: He is alert and oriented to person, place, and time.  Skin: Skin is warm and dry. No rash noted. No erythema.  Psychiatric: Mood and affect normal.     No results found for this or any previous visit (from the past 72 hour(s)).  Assessment/Plan:  Abdominal pain, right upper quadrant Etiology is Cholelithasis vs gastric upset vs cholecystitis. Highly unlikely to be cholecystitis as pain is not severe, no fever, and this has been ongoing for some time.  Obtain RUQ U/S to see if gallstones are present. Avoid fatty  foods and spicy foods and eggs that seem to upset the stomach.  Social anxiety disorder Patient is endorsing years of anxiety which he feels has gotten worse over the past 6 months to where he now feels anxiety at home.   I will start him on Buspirone 7.5mg  x 5 and titrate up if needed.   Monitor with GAD-7 to see if this helps his symptoms.  Major depressive  disorder, recurrent severe without psychotic features [facility]) Patient was recently hospitalized due to endorsing suicidal ideation after break up with his girlfriend. He was treated and now endorses now suicidal ideation, intent to harm himself or others.   He is being seen and his depression is being managed by [facility]. Continue anti-depression medications per Psych.     PATIENT EDUCATION PROVIDED: See AVS    Diagnosis and plan along with any newly prescribed medication(s) were discussed in detail with this patient today. The patient verbalized understanding and agreed with the plan. Patient advised if symptoms worsen return to clinic or ER.   Health Maintainance:   Orders Placed This Encounter  Procedures  . US Abdomen Limited RUQ    Epic order Wt. 235/ no needs Self pay Pda/ April- [phone]    Standing Status:   Future    Standing Expiration Date:   04/24/2018    Order Specific Question:   Reason for Exam (SYMPTOM  OR DIAGNOSIS REQUIRED)    Answer:   RUQ Abd. pain    Order Specific Question:   Preferred imaging location?    Answer:   [facility]    Meds ordered this encounter  Medications  . busPIRone (BUSPAR) 7.5 MG tablet    Sig: Take 1 tablet (7.5 mg total) by mouth 2 (two) times daily.    Dispense:  60 tablet    Refill:  0  . fluvoxaMINE (LUVOX) 100 MG tablet    Sig: Take 1 tablet (100 mg total) by mouth at bedtime. For mood control    Dispense:  30 tablet    Refill:  0     Jules Schick, DO 10/31/2017, 8:29 PM PGY-1, [facility]

## 2017-10-28 ENCOUNTER — Encounter: Payer: Self-pay | Admitting: Family Medicine

## 2017-10-31 NOTE — Assessment & Plan Note (Signed)
Patient is endorsing years of anxiety which he feels has gotten worse over the past 6 months to where he now feels anxiety at home.   I will start him on Buspirone 7.5mg  x 5 and titrate up if needed.   Monitor with GAD-7 to see if this helps his symptoms.

## 2017-10-31 NOTE — Assessment & Plan Note (Signed)
Patient was recently hospitalized due to endorsing suicidal ideation after break up with his girlfriend. He was treated and now endorses now suicidal ideation, intent to harm himself or others.   He is being seen and [facility]. Continue anti-depression medications per Psych.

## 2017-10-31 NOTE — Patient Instructions (Addendum)
It was great to meet you today! Thank you for letting me participate in your care!  Today, we discussed your recent hospitalization for depression. It is important that you have follow up. Please continue taking your depression medications as prescribed.   I have started you on Buspirone for anxiety. Please take it as prescribed.  I have also sent you for a RUQ ultrasound to see if gallstones are a cause of your abdominal pain. I will call you if the results are abnormal.  Be well, Jules Schickim Meriah Shands, DO PGY-1, [facility]

## 2017-10-31 NOTE — Assessment & Plan Note (Signed)
Etiology is Cholelithasis vs gastric upset vs cholecystitis. Highly unlikely to be cholecystitis as pain is not severe, no fever, and this has been ongoing for some time.  Obtain RUQ U/S to see if gallstones are present. Avoid fatty foods and spicy foods and eggs that seem to upset the stomach.

## 2017-11-01 ENCOUNTER — Telehealth: Payer: Self-pay | Admitting: Licensed Clinical Social Worker

## 2017-11-01 NOTE — Progress Notes (Signed)
Service : Integrated Behavioral Health F/U Call   2nd F/U call to patient reference interventions discussed and resources provided during warm handoff from PCP.  Phone range, voice message option not set up, unable to leave a message. Called patient's mother, phone not in service LCSW provided patient and mother with a business card during office visit to F/U if they had questions.   Vincent Hineseborah Bassem Bernasconi, LCSW Licensed Clinical Social Worker [facility]   [phone] 1:33 PM

## 2017-11-03 IMAGING — DX DG CHEST 2V
2 series · 2 of 2 positions shown · non-contrast
Comparison: 09/04/2005

CLINICAL DATA: Woke up this morning with LEFT side chest pain,
history GERD

EXAM:
CHEST  2 VIEW

[chest pa]
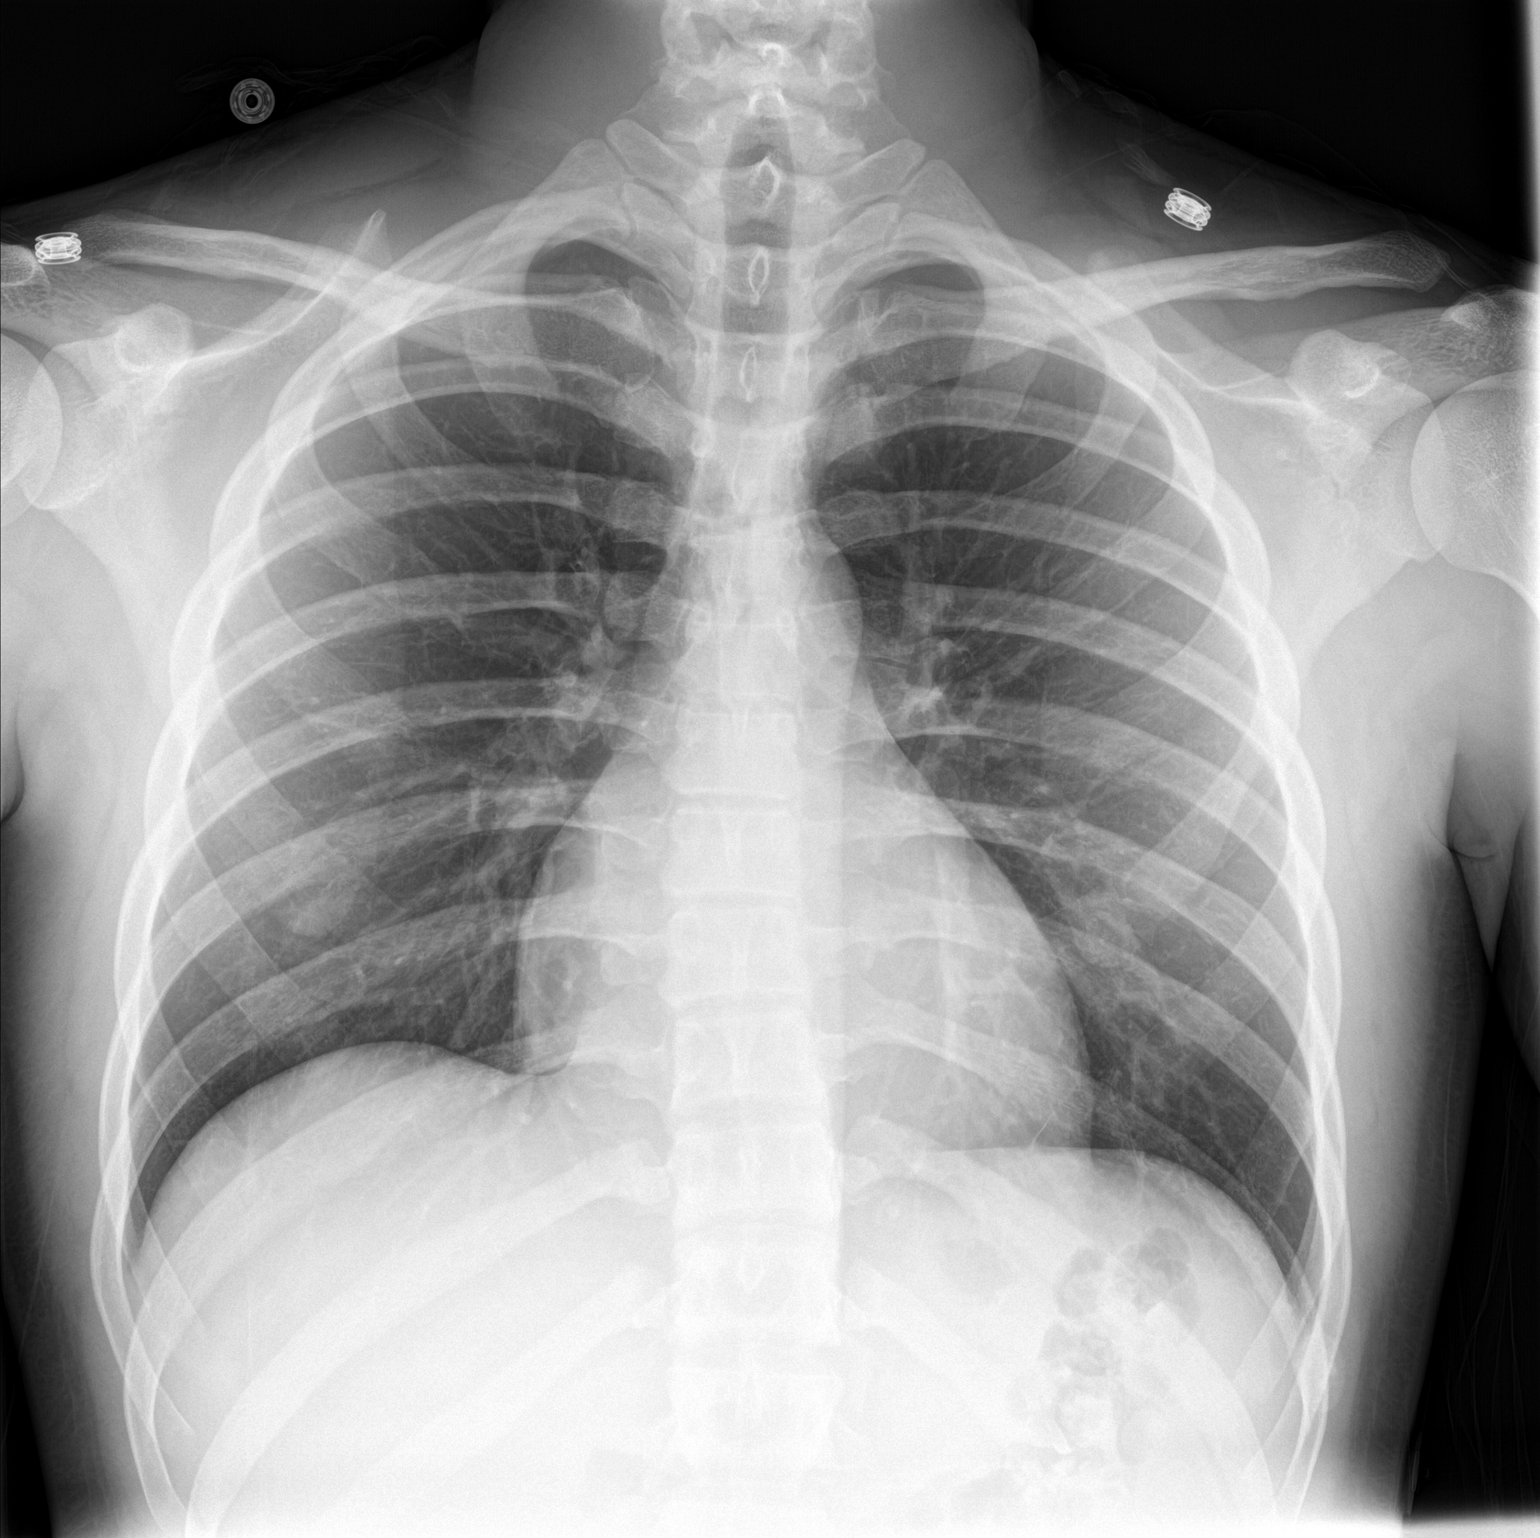

[chest lat]
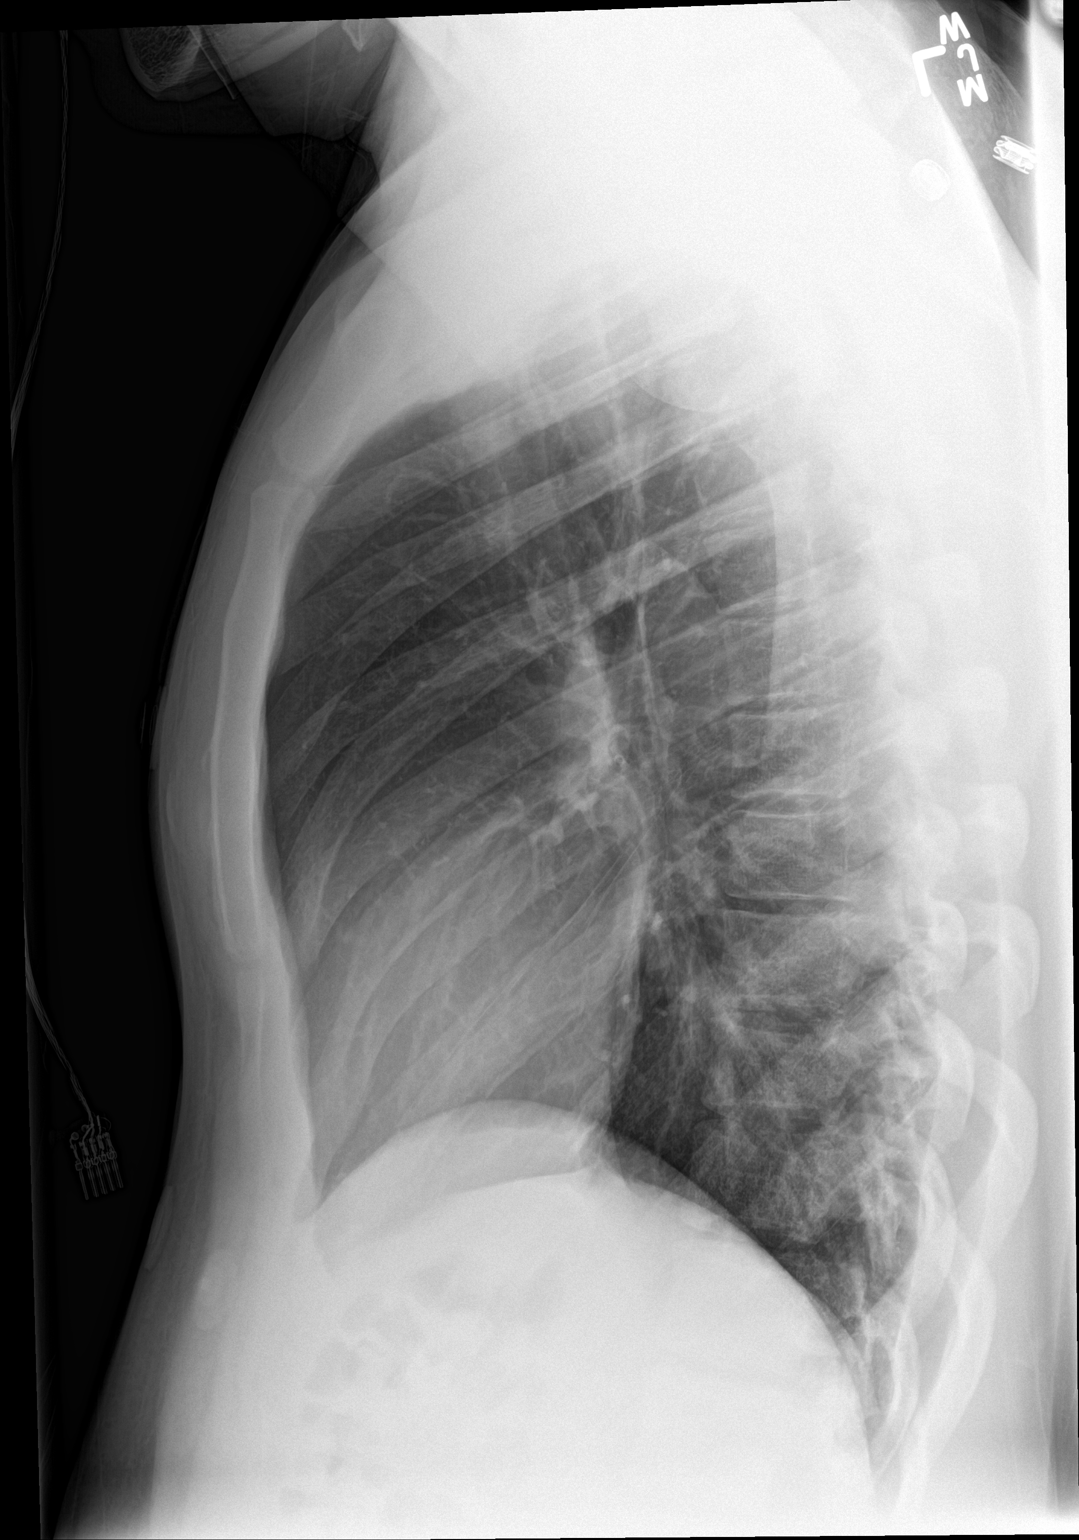

[2 of 2 positions shown; findings below may reference images not displayed]

FINDINGS: Upper normal heart size.

Mediastinal contours and pulmonary vascularity normal.

Lungs clear.

No pleural effusion or pneumothorax.

Osseous mineralization normal without focal bony abnormality.
IMPRESSION: No acute abnormalities.

## 2017-11-03 NOTE — Progress Notes (Signed)
Service : Integrated Behavioral Health F/U Call   3rd F/U call to patient reference interventions discussed and resources provided during warm handoff from PCP.  LCSW unable to leave a message. Plan: LCSW will wait for patient to contact office if additional services or resources are needed.  Sammuel Hineseborah Moore, LCSW Licensed Clinical Social Worker [facility]   [phone] 10:01 AM

## 2017-11-06 ENCOUNTER — Other Ambulatory Visit: Payer: Self-pay

## 2017-11-13 ENCOUNTER — Other Ambulatory Visit: Payer: Self-pay | Admitting: Family Medicine

## 2017-11-18 ENCOUNTER — Other Ambulatory Visit: Payer: Self-pay | Admitting: Family Medicine

## 2017-11-21 ENCOUNTER — Other Ambulatory Visit: Payer: Self-pay | Admitting: Family Medicine

## 2017-11-27 ENCOUNTER — Other Ambulatory Visit: Payer: Self-pay

## 2017-11-28 MED ORDER — BUSPIRONE HCL 7.5 MG PO TABS: 7.5000 mg | ORAL_TABLET | Freq: Two times a day (BID) | ORAL | 0 refills | Status: AC

## 2017-11-28 MED ORDER — FLUVOXAMINE MALEATE 100 MG PO TABS: 100.0000 mg | ORAL_TABLET | Freq: Every day | ORAL | 0 refills | Status: DC

## 2017-12-09 ENCOUNTER — Other Ambulatory Visit: Payer: Self-pay | Admitting: Family Medicine

## 2017-12-22 ENCOUNTER — Encounter: Payer: Self-pay | Admitting: Family Medicine

## 2017-12-28 ENCOUNTER — Encounter: Payer: Self-pay | Admitting: Family Medicine

## 2018-01-15 ENCOUNTER — Other Ambulatory Visit: Payer: Self-pay | Admitting: Family Medicine

## 2018-01-17 ENCOUNTER — Telehealth: Payer: Self-pay | Admitting: *Deleted

## 2018-01-17 NOTE — Telephone Encounter (Signed)
-----   Message from Arlyce Harmanimothy Lockamy, DO sent at 01/17/2018  2:13 PM EDT ----- Regarding: Refill requests Hey Jezebel Pollet, this patient keeps requesting refills from me but these meds were originally started and should be monitored by his psychiatrist. I agreed to refill them for him once but he continues to request refills from me.  Can the red team please call him and let him know he needs to call his Psychiatrist, be seen and evaluated, and get refills through them? Thanks!  Tim

## 2018-01-17 NOTE — Telephone Encounter (Signed)
Attempted to call pt, no answer and voicemail full. Will try again later. Deseree Blount, CMA  

## 2018-01-18 NOTE — Telephone Encounter (Deleted)
-----   Message from Timothy Lockamy, DO sent at 01/17/2018  2:13 PM EDT ----- Regarding: Refill requests Hey Deseree, this patient keeps requesting refills from me but these meds were originally started and should be monitored by his psychiatrist. I agreed to refill them for him once but he continues to request refills from me.  Can the red team please call him and let him know he needs to call his Psychiatrist, be seen and evaluated, and get refills through them? Thanks!  Tim  

## 2018-01-19 NOTE — Telephone Encounter (Signed)
Sent my chart message. Deseree Blount, CMA  

## 2018-01-22 ENCOUNTER — Other Ambulatory Visit: Payer: Self-pay | Admitting: Family Medicine

## 2018-01-24 ENCOUNTER — Ambulatory Visit (INDEPENDENT_AMBULATORY_CARE_PROVIDER_SITE_OTHER): Payer: Self-pay | Admitting: Family Medicine

## 2018-01-24 ENCOUNTER — Other Ambulatory Visit: Payer: Self-pay

## 2018-01-24 ENCOUNTER — Encounter: Payer: Self-pay | Admitting: Family Medicine

## 2018-01-24 VITALS — BP 124/70 | HR 79 | Temp 98.7°F | Ht 72.0 in | Wt 235.0 lb

## 2018-01-24 DIAGNOSIS — F332 Major depressive disorder, recurrent severe without psychotic features: Secondary | ICD-10-CM

## 2018-01-24 DIAGNOSIS — Z0000 Encounter for general adult medical examination without abnormal findings: Secondary | ICD-10-CM

## 2018-01-24 MED ORDER — QUETIAPINE FUMARATE 25 MG PO TABS: ORAL_TABLET | ORAL | 0 refills | Status: DC

## 2018-01-24 NOTE — Progress Notes (Addendum)
Subjective: Chief Complaint  Patient presents with  . Medication Refill    HPI: Vincent Valenzuela is a 22 y.o. presenting to clinic today to discuss the following:  Medication Request Vincent Valenzuela is a 21y/o male presenting for a refill for his medication of Seroquel. He has not been to see a Psychiatrist since our last visit. Per our last visit, I had agreed to temporarily refill his depression and anxiety medications until he could establish care with another Psychiatrist. He has not done that yet. I explained to him and his mom the importance of establishing care with a Psychiatrist to have proper management of his depression and anxiety as he is on 4+ medications. I informed them I will not be sending another refill until they establish care but would refill his Seroquel today since he was about to run out. Patient and mom expressed understanding and agreement.   He denies any suicidal ideations or harmful intentions. He does not feel like his depression is getting worse but still has many days where he lacks energy and motivation to do things that interest him. He is still having panic attacks several times per month.   He denies fever, chills, weight loss, nausea, vomiting, abdominal pain, arm or limb stiffness, muscle aches, difficulty urinating, diarrhea, or constipation.  PHQ-9 score of 17 with question 10 answered as "extremely difficult"  Health Maintenance: HIV routine screening     ROS noted in HPI.   Past Medical, Surgical, Social, and Family History Reviewed & Updated per EMR.   Pertinent Historical Findings include:   Social History   Tobacco Use  Smoking Status Passive Smoke Exposure - Never Smoker  Smokeless Tobacco Never Used    Objective: BP 124/70   Pulse 79   Temp 98.7 F (37.1 C) (Oral)   Ht 6' (1.829 m)   Wt 235 lb (106.6 kg)   SpO2 99%   BMI 31.87 kg/m  Vitals and nursing notes reviewed  Physical Exam  Constitutional: He is oriented to  person, place, and time. He appears well-developed and well-nourished. No distress.  HENT:  Head: Normocephalic and atraumatic.  Eyes: Pupils are equal, round, and reactive to light. Conjunctivae are normal.  Neck: Normal range of motion.  Cardiovascular: Normal rate, regular rhythm, normal heart sounds and intact distal pulses.  No murmur heard. Pulmonary/Chest: Effort normal and breath sounds normal. No respiratory distress. He has no wheezes. He has no rales.  Abdominal: Soft. Bowel sounds are normal. He exhibits no distension. There is no tenderness. There is no guarding.  Musculoskeletal: Normal range of motion. He exhibits no edema.  Neurological: He is alert and oriented to person, place, and time.  Skin: Skin is warm and dry. Capillary refill takes less than 2 seconds. No erythema.   Results for orders placed or performed in visit on 01/24/18 (from the past 72 hour(s))  HIV antibody (with reflex)     Status: None   Collection Time: 01/24/18  2:34 PM  Result Value Ref Range   HIV Screen 4th Generation wRfx Non Reactive Non Reactive    Assessment/Plan:  Major depressive disorder, recurrent severe without psychotic features (HCC) Refill of Seroquel as a one time refill until he gets established with a new Psychiatrist patient and as his mom no longer want to go to [place].  Referral to [facility].  Healthcare maintenance One time HIV screen. Negative result.  Will update patient.   PATIENT EDUCATION PROVIDED: See AVS  Diagnosis and plan along with any newly prescribed medication(s) were discussed in detail with this patient today. The patient verbalized understanding and agreed with the plan. Patient advised if symptoms worsen return to clinic or ER.   Health Maintainance:   Orders Placed This Encounter  Procedures  . HIV antibody (with reflex)  . Ambulatory referral to Psychiatry    Referral Priority:   Routine    Referral Type:   Psychiatric     Referral Reason:   Specialty Services Required    Requested Specialty:   Psychiatry    Number of Visits Requested:   1    Meds ordered this encounter  Medications  . QUEtiapine (SEROQUEL) 25 MG tablet    Sig: TAKE ONE TABLET BY MOUTH TWICE A DAY FOR MOOD CONTROL    Dispense:  60 tablet    Refill:  0    Tim Karen Chafe, DO 01/25/2018, 10:46 AM PGY-1, [facility]

## 2018-01-24 NOTE — Patient Instructions (Signed)
It was great to see you today! Thank you for letting me participate in your care!  Today, we discussed your depression and the medications you are taking to help control your symptoms. I have sent a refill of Seroquel to your pharmacy.  I have also made a referral for you to be seen by a Psychiatrist to help get you the best possible care to manage your depression. If you begin to have side effects from your medication please seek care immediately.   Be well, Jules Schick, DO PGY-1, [facility]

## 2018-01-25 ENCOUNTER — Other Ambulatory Visit: Payer: Self-pay | Admitting: Family Medicine

## 2018-01-25 DIAGNOSIS — Z0000 Encounter for general adult medical examination without abnormal findings: Secondary | ICD-10-CM | POA: Insufficient documentation

## 2018-01-25 LAB — HIV ANTIBODY (ROUTINE TESTING W REFLEX): HIV SCREEN 4TH GENERATION: NONREACTIVE

## 2018-01-25 NOTE — Assessment & Plan Note (Signed)
One time HIV screen. Negative result.  Will update patient.

## 2018-01-25 NOTE — Assessment & Plan Note (Signed)
Refill of Seroquel as a one time refill until he gets established with a new Psychiatrist patient and as his mom no longer want to go to [place].  Referral to [facility].

## 2018-01-25 NOTE — Progress Notes (Unsigned)
Sending letter to patient with result of routine HIV screen. Result was negative for HIV.

## 2018-02-02 ENCOUNTER — Encounter: Payer: Self-pay | Admitting: Family Medicine

## 2018-02-05 ENCOUNTER — Other Ambulatory Visit: Payer: Self-pay | Admitting: Family Medicine

## 2018-02-20 NOTE — Telephone Encounter (Signed)
Hey Deseree, I responded to this like two weeks ago but is he asking again?

## 2018-03-09 ENCOUNTER — Encounter: Payer: Self-pay | Admitting: Family Medicine

## 2018-03-09 ENCOUNTER — Other Ambulatory Visit: Payer: Self-pay | Admitting: Family Medicine

## 2018-03-10 MED ORDER — QUETIAPINE FUMARATE 25 MG PO TABS: ORAL_TABLET | ORAL | 0 refills | Status: DC

## 2018-03-10 MED ORDER — FLUVOXAMINE MALEATE 100 MG PO TABS: 100.0000 mg | ORAL_TABLET | Freq: Every day | ORAL | 0 refills | Status: DC

## 2018-04-18 ENCOUNTER — Other Ambulatory Visit: Payer: Self-pay | Admitting: Family Medicine

## 2018-05-02 ENCOUNTER — Encounter: Payer: Self-pay | Admitting: Family Medicine

## 2018-05-03 ENCOUNTER — Other Ambulatory Visit: Payer: Self-pay | Admitting: Family Medicine

## 2018-05-03 MED ORDER — QUETIAPINE FUMARATE 25 MG PO TABS: ORAL_TABLET | ORAL | 0 refills | Status: AC

## 2018-05-03 MED ORDER — FLUVOXAMINE MALEATE 100 MG PO TABS: 100.0000 mg | ORAL_TABLET | Freq: Every day | ORAL | 0 refills | Status: AC

## 2018-05-13 ENCOUNTER — Other Ambulatory Visit: Payer: Self-pay | Admitting: Family Medicine

## 2018-05-22 ENCOUNTER — Ambulatory Visit: Payer: Medicaid Other | Admitting: Licensed Clinical Social Worker

## 2018-05-22 ENCOUNTER — Ambulatory Visit (HOSPITAL_COMMUNITY): Payer: Self-pay | Admitting: Psychiatry

## 2018-05-22 ENCOUNTER — Encounter: Payer: Self-pay | Admitting: Licensed Clinical Social Worker

## 2018-05-22 NOTE — Progress Notes (Signed)
Type of Service: Clinical Social Work  Social work consult from [employer] [facility] patient and mother needs to speak with Child psychotherapistocial worker.  LCSW spoke with patient and mother to assess the need.  [facility] and the appointment was canceled.  Per patient' mom the appointment was rescheduled for 2 months out.   Patient and mom wanted additional resources to assist patient now.  Intervention: LCSW reviewed community resources and provided contact information for two agencies that provides medication management, ongoing therapy that also takes Medicaid.  ( [facility] and [facility]) Plan:  Patient and mother will contact both agencies, and F/U with PCP as directed next week.  Sammuel Hineseborah Moore, LCSW Licensed Clinical Social Worker [facility]   [phone] 12:12 PM

## 2018-05-29 ENCOUNTER — Other Ambulatory Visit: Payer: Self-pay | Admitting: Family Medicine

## 2018-06-01 ENCOUNTER — Ambulatory Visit: Payer: Medicaid Other | Admitting: Family Medicine

## 2018-06-28 ENCOUNTER — Other Ambulatory Visit: Payer: Self-pay | Admitting: Family Medicine

## 2018-07-25 ENCOUNTER — Ambulatory Visit (HOSPITAL_COMMUNITY): Payer: Self-pay | Admitting: Psychiatry

## 2018-07-28 ENCOUNTER — Other Ambulatory Visit: Payer: Self-pay | Admitting: Family Medicine

## 2018-08-27 ENCOUNTER — Other Ambulatory Visit: Payer: Self-pay | Admitting: Family Medicine

## 2018-09-23 ENCOUNTER — Emergency Department
Admission: EM | Admit: 2018-09-23 | Discharge: 2018-09-23 | Disposition: A | Discharge status: Home / Self Care | Payer: Self-pay | Attending: Emergency Medicine | Admitting: Emergency Medicine

## 2018-09-23 ENCOUNTER — Encounter: Payer: Self-pay | Admitting: Emergency Medicine

## 2018-09-23 ENCOUNTER — Other Ambulatory Visit: Payer: Self-pay

## 2018-09-23 ENCOUNTER — Emergency Department: Payer: Self-pay

## 2018-09-23 DIAGNOSIS — Y929 Unspecified place or not applicable: Secondary | ICD-10-CM | POA: Insufficient documentation

## 2018-09-23 DIAGNOSIS — Z7722 Contact with and (suspected) exposure to environmental tobacco smoke (acute) (chronic): Secondary | ICD-10-CM | POA: Insufficient documentation

## 2018-09-23 DIAGNOSIS — S161XXA Strain of muscle, fascia and tendon at neck level, initial encounter: Secondary | ICD-10-CM | POA: Insufficient documentation

## 2018-09-23 DIAGNOSIS — Y9372 Activity, wrestling: Secondary | ICD-10-CM | POA: Insufficient documentation

## 2018-09-23 DIAGNOSIS — M542 Cervicalgia: Secondary | ICD-10-CM

## 2018-09-23 DIAGNOSIS — S40011A Contusion of right shoulder, initial encounter: Secondary | ICD-10-CM | POA: Insufficient documentation

## 2018-09-23 DIAGNOSIS — X58XXXA Exposure to other specified factors, initial encounter: Secondary | ICD-10-CM | POA: Insufficient documentation

## 2018-09-23 DIAGNOSIS — Z79899 Other long term (current) drug therapy: Secondary | ICD-10-CM | POA: Insufficient documentation

## 2018-09-23 DIAGNOSIS — Y999 Unspecified external cause status: Secondary | ICD-10-CM | POA: Insufficient documentation

## 2018-09-23 MED ORDER — IBUPROFEN 600 MG PO TABS: 600.0000 mg | ORAL_TABLET | Freq: Four times a day (QID) | ORAL | 0 refills | Status: DC | PRN

## 2018-09-23 MED ORDER — CYCLOBENZAPRINE HCL 5 MG PO TABS: 5.0000 mg | ORAL_TABLET | Freq: Three times a day (TID) | ORAL | 0 refills | Status: AC | PRN

## 2018-09-23 MED ORDER — CYCLOBENZAPRINE HCL 10 MG PO TABS
10.0000 mg | ORAL_TABLET | Freq: Once | ORAL | Status: AC
  Administered 2018-09-23: 10 mg via ORAL
  Filled 2018-09-23: qty 1

## 2018-09-23 MED ORDER — IBUPROFEN 800 MG PO TABS
800.0000 mg | ORAL_TABLET | Freq: Once | ORAL | Status: AC
  Administered 2018-09-23: 800 mg via ORAL
  Filled 2018-09-23: qty 1

## 2018-09-23 NOTE — ED Provider Notes (Signed)
[facility] EMERGENCY [facility]PARTMENT Provider Note   CSN: [id number] Arrival date & time: 09/23/18  1515     History   Chief Complaint Chief Complaint  Patient presents with  . Neck Pain    HPI Vincent Valenzuela is a 22 y.o. male presents to the emergency department for evaluation of right-sided neck pain.  Patient states last night he was wrestling on the ground when he was thrown onto his right shoulder and neck.  He had very little pain last night, he describes a soreness.  He denies any numbness tingling or radicular symptoms.  No headache, loss of consciousness nausea or vomiting.  Today he describes a constant throbbing pain along the right paravertebral muscles of the cervical spine, pain is 9 out of 10.  Has not taken any Tylenol or ibuprofen for symptoms.  Denies any chest pain or shortness of breath.  Patient does admit to having some pain along the distal clavicle and on the right shoulder that is mild.  HPI  Past Medical History:  Diagnosis Date  . Allergy   . Anxiety   . GERD (gastroesophageal reflux disease)   . Headaches, cluster     Patient Active Problem List   Diagnosis Date Noted  . Healthcare maintenance 01/25/2018  . Abdominal pain, right upper quadrant 10/25/2017  . 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 Cannabis use disorder, moderate, dependence (HCC) 07/07/2017  . Major depressive disorder, recurrent severe without psychotic features (HCC) 07/07/2017  . Overdose 07/07/2017  . PTSD (post-traumatic stress disorder) 07/07/2017  . OCD (obsessive compulsive disorder) 07/07/2017  . Strain of left trapezius muscle 06/26/2015  . Rotator cuff syndrome 03/12/2014  . Back pain 10/04/2013  . Social anxiety disorder 06/14/2013    History reviewed. No pertinent surgical history.      Home Medications    Prior to Admission medications   Medication Sig Start Date End Date Taking? Authorizing  Provider  busPIRone (BUSPAR) 7.5 MG tablet Take 1 tablet (7.5 mg total) by mouth 2 (two) times daily. 11/28/17   Arlyce HarmanLockamy, Timothy, DO  fluvoxaMINE (LUVOX) 100 MG tablet Take 1 tablet (100 mg total) by mouth at bedtime. 05/03/18   Lockamy, Marcial Pacasimothy, DO  QUEtiapine (SEROQUEL) 25 MG tablet TAKE ONE TABLET BY MOUTH TWICE A DAY FOR MOOD CONTROL 05/03/18   Arlyce HarmanLockamy, Timothy, DO  traZODone (DESYREL) 50 MG tablet Take 1 tablet (50 mg total) by mouth at bedtime as needed for sleep. 08/24/17   Money, Gerlene Burdockravis B, FNP    Family History Family History  Problem Relation Age of Onset  . Lupus Mother   . Depression Mother   . Depression Father   . Alcohol abuse Father   . Hypertension Father   . Lupus Maternal Grandmother   . Diabetes Maternal Grandmother   . Hypertension Maternal Grandmother   . Hyperlipidemia Maternal Grandmother   . Diabetes Maternal Grandfather   . Hypertension Maternal Grandfather   . Hyperlipidemia Maternal Grandfather   . Diabetes Paternal Grandmother   . Hypertension Paternal Grandmother   . Hyperlipidemia Paternal Grandmother   . Diabetes Paternal Grandfather   . Hypertension Paternal Grandfather   . Hyperlipidemia Paternal Grandfather     Social History Social History   Tobacco Use  . Smoking status: Passive Smoke Exposure - Never Smoker  . Smokeless tobacco: Never Used  Substance Use Topics  . Alcohol use: No  . Drug use: No     Allergies  Patient has no known allergies.   Review of Systems Review of Systems  Constitutional: Negative.  Negative for activity change, appetite change, chills and fever.  HENT: Negative for ear pain, facial swelling, nosebleeds, rhinorrhea, sinus pressure, sore throat and trouble swallowing.   Eyes: Negative for photophobia, pain and discharge.  Respiratory: Negative for cough, chest tightness and shortness of breath.   Cardiovascular: Negative for chest pain and leg swelling.  Gastrointestinal: Negative for abdominal distention,  abdominal pain, diarrhea, nausea and vomiting.  Musculoskeletal: Positive for myalgias and neck pain. Negative for arthralgias, back pain and gait problem.  Skin: Negative for color change and rash.  Neurological: Negative for dizziness, weakness and headaches.  Hematological: Negative for adenopathy.  Psychiatric/Behavioral: Negative for agitation and behavioral problems.     Physical Exam Updated Vital Signs BP 116/66   Pulse 60   Temp 98.6 F (37 C) (Oral)   Resp 16   Ht 5\' 11"  (1.803 m)   Wt 93 kg   SpO2 99%   BMI 28.59 kg/m   Physical Exam Constitutional:      Appearance: He is well-developed.  HENT:     Head: Normocephalic and atraumatic.  Eyes:     Conjunctiva/sclera: Conjunctivae normal.  Neck:     Musculoskeletal: Normal range of motion.  Cardiovascular:     Rate and Rhythm: Normal rate.  Pulmonary:     Effort: Pulmonary effort is normal. No respiratory distress.  Musculoskeletal: Normal range of motion.     Comments: Patient with full range of motion of cervical spine.  Negative Spurling's test.  Nontender along the spinous process but does have right paravertebral muscle tenderness along the trapezius.  There is no warmth or redness.  Patient is mildly tender along the distal clavicle of the right shoulder.  Has full active range of motion of the shoulder.  Skin:    General: Skin is warm.     Findings: No rash.  Neurological:     Mental Status: He is alert and oriented to person, place, and time.  Psychiatric:        Behavior: Behavior normal.        Thought Content: Thought content normal.      ED Treatments / Results  Labs (all labs ordered are listed, but only abnormal results are displayed) Labs Reviewed - No data to display  EKG None  Radiology Dg Cervical Spine 2-3 Views  Result Date: 09/23/2018 CLINICAL DATA:  neck pain, trauma EXAM: CERVICAL SPINE - 2-3 VIEW COMPARISON:  CT of the cervical spine on 07/02/2017 FINDINGS: There is loss of  cervical lordosis. Otherwise alignment is normal. No acute fracture or traumatic subluxation. Prevertebral soft tissues are normal in appearance. Lung apices are clear. IMPRESSION: 1. Loss of lordosis. 2. No evidence for acute fracture. Electronically Signed   By: Norva PavlovElizabeth  Brown M.D.   On: 09/23/2018 17:54   Dg Shoulder Right  Result Date: 09/23/2018 CLINICAL DATA:  Pt states he was wrestling last night and fell onto right shoulder. Right sided neck pain radiating into right shoulder. Pain initially radiated to his right fingers. EXAM: RIGHT SHOULDER - 2+ VIEW COMPARISON:  None. FINDINGS: There is no evidence of fracture or dislocation. There is no evidence of arthropathy or other focal bone abnormality. Soft tissues are unremarkable. IMPRESSION: Negative. Electronically Signed   By: Norva PavlovElizabeth  Brown M.D.   On: 09/23/2018 17:50    Procedures Procedures (including critical care time)  Medications Ordered in ED Medications  ibuprofen (ADVIL,MOTRIN)  tablet 800 mg (800 mg Oral Given 09/23/18 1729)  cyclobenzaprine (FLEXERIL) tablet 10 mg (10 mg Oral Given 09/23/18 1729)     Initial Impression / Assessment and Plan / ED Course  I have reviewed the triage vital signs and the nursing notes.  Pertinent labs & imaging results that were available during my care of the patient were reviewed by me and considered in my medical decision making (see chart for details).    22 year old male with right-sided cervical strain and right shoulder contusion.  X-rays show no evidence of acute bony abnormality.  No cervical radicular symptoms.  Patient saw improvement with Flexeril, ibuprofen.  Patient understands signs symptoms return to ED for.   Final Clinical Impressions(s) / ED Diagnoses   Final diagnoses:  Acute strain of neck muscle, initial encounter  Neck pain    ED Discharge Orders    None       Ronnette Juniper 09/23/18 1806    Phineas Semen, MD 09/23/18 [phone]

## 2018-09-23 NOTE — Discharge Instructions (Signed)
Please take medications as prescribed.  Apply ice to the cervical spine muscles.  If any increasing pain numbness or tingling return to the emergency department.

## 2018-09-23 NOTE — ED Notes (Signed)
See triage note. Pt c/o R neck pain. Currently sitting in bed resting with family at bedside.

## 2018-09-23 NOTE — ED Triage Notes (Signed)
Pt to ED via POV c/o neck pain. Pt states that he was wrestling last night. Pt states that he landed sideways and his left shoulder and head hit the mat. Pt denies LOC. Pt states that neck felt tender last night and when he woke up this morning he was having sharp pains. Pt is in NAD at this time.

## 2019-01-15 ENCOUNTER — Emergency Department (HOSPITAL_COMMUNITY)
Admission: EM | Admit: 2019-01-15 | Discharge: 2019-01-16 | Disposition: A | Discharge status: Home / Self Care | Payer: Medicaid Other | Attending: Emergency Medicine | Admitting: Emergency Medicine

## 2019-01-15 ENCOUNTER — Encounter (HOSPITAL_COMMUNITY): Payer: Self-pay

## 2019-01-15 ENCOUNTER — Other Ambulatory Visit: Payer: Self-pay

## 2019-01-15 DIAGNOSIS — Z7722 Contact with and (suspected) exposure to environmental tobacco smoke (acute) (chronic): Secondary | ICD-10-CM | POA: Insufficient documentation

## 2019-01-15 DIAGNOSIS — F419 Anxiety disorder, unspecified: Secondary | ICD-10-CM

## 2019-01-15 DIAGNOSIS — Z79899 Other long term (current) drug therapy: Secondary | ICD-10-CM | POA: Insufficient documentation

## 2019-01-15 MED ORDER — HYDROXYZINE HCL 25 MG PO TABS: 25.0000 mg | ORAL_TABLET | Freq: Four times a day (QID) | ORAL | 0 refills | Status: AC

## 2019-01-15 NOTE — Discharge Instructions (Addendum)
See resource list for outpatient counseling resources. Take hydroxyzine as prescribed. Return to ER for new or worsening symptoms.

## 2019-01-15 NOTE — ED Triage Notes (Signed)
Pt reports increased anxiety today, denies any recent increase in stress or certain situation that increased his anxiety today. Pt states its been a year since he has taken anything for anxiety. Denies SI/HI, no AH/VH. Pt calm and cooperative in triage

## 2019-01-15 NOTE — ED Provider Notes (Signed)
[facility] EMERGENCY DEPARTMENT Provider Note   CSN: [id number] Arrival date & time: 01/15/19  2226    History   Chief Complaint Chief Complaint  Patient presents with  . Anxiety    HPI Vincent Valenzuela is a 23 y.o. male.     23 year old male presents with complaint of panic attacks.  Patient states that he had panic attacks since he was 23 years old, states that symptoms have been worse recently due to losing his job related to coronavirus.  Patient is normal coping mechanisms going outside to exercise or play basketball does not seem to be helping him or bring him joy.  Patient states that a year ago he had a "mental breakdown", states his girlfriend left him and he lost his job and he has been thinking about this event a lot recently.  Patient denies suicidal homicidal ideation.  Patient previously went to [facility], states medications he is taken in the past have made him feel "numb" or made him feel that his personality is different on medication.  Patient states that tonight he felt short of breath and his whole body was shaking and he felt like he could not calm himself down or control his symptoms so he came to the emergency room.  Upon evaluation symptoms have resolved and patient is no longer feeling this way, states that he now just feels "mentally tired." No other complaints or concerns.      Past Medical History:  Diagnosis Date  . Allergy   . Anxiety   . GERD (gastroesophageal reflux disease)   . Headaches, cluster     Patient Active Problem List   Diagnosis Date Noted  . Healthcare maintenance 01/25/2018  . Abdominal pain, right upper quadrant 10/25/2017  . 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 Cannabis use disorder, moderate, dependence (HCC) 07/07/2017  . Major depressive disorder, recurrent severe without psychotic features (HCC) 07/07/2017  . Overdose 07/07/2017  . PTSD (post-traumatic stress  disorder) 07/07/2017  . OCD (obsessive compulsive disorder) 07/07/2017  . Strain of left trapezius muscle 06/26/2015  . Rotator cuff syndrome 03/12/2014  . Back pain 10/04/2013  . Social anxiety disorder 06/14/2013    History reviewed. No pertinent surgical history.      Home Medications    Prior to Admission medications   Medication Sig Start Date End Date Taking? Authorizing Provider  busPIRone (BUSPAR) 7.5 MG tablet Take 1 tablet (7.5 mg total) by mouth 2 (two) times daily. 11/28/17   Arlyce Harman, DO  cyclobenzaprine (FLEXERIL) 5 MG tablet Take 1-2 tablets (5-10 mg total) by mouth 3 (three) times daily as needed for muscle spasms. 09/23/18   Evon Slack, PA-C  fluvoxaMINE (LUVOX) 100 MG tablet Take 1 tablet (100 mg total) by mouth at bedtime. 05/03/18   Arlyce Harman, DO  hydrOXYzine (ATARAX/VISTARIL) 25 MG tablet Take 1 tablet (25 mg total) by mouth every 6 (six) hours. 01/15/19   Jeannie Fend, PA-C  ibuprofen (ADVIL,MOTRIN) 600 MG tablet Take 1 tablet (600 mg total) by mouth every 6 (six) hours as needed for moderate pain. 09/23/18   Evon Slack, PA-C  QUEtiapine (SEROQUEL) 25 MG tablet TAKE ONE TABLET BY MOUTH TWICE A DAY FOR MOOD CONTROL 05/03/18   Arlyce Harman, DO  traZODone (DESYREL) 50 MG tablet Take 1 tablet (50 mg total) by mouth at bedtime as needed for sleep. 08/24/17   Money, Gerlene Burdock, FNP    Family History Family  History  Problem Relation Age of Onset  . Lupus Mother   . Depression Mother   . Depression Father   . Alcohol abuse Father   . Hypertension Father   . Lupus Maternal Grandmother   . Diabetes Maternal Grandmother   . Hypertension Maternal Grandmother   . Hyperlipidemia Maternal Grandmother   . Diabetes Maternal Grandfather   . Hypertension Maternal Grandfather   . Hyperlipidemia Maternal Grandfather   . Diabetes Paternal Grandmother   . Hypertension Paternal Grandmother   . Hyperlipidemia Paternal Grandmother   . Diabetes Paternal  Grandfather   . Hypertension Paternal Grandfather   . Hyperlipidemia Paternal Grandfather     Social History Social History   Tobacco Use  . Smoking status: Passive Smoke Exposure - Never Smoker  . Smokeless tobacco: Never Used  Substance Use Topics  . Alcohol use: No  . Drug use: No     Allergies   Patient has no known allergies.   Review of Systems Review of Systems  Constitutional: Negative for fever.  Respiratory: Positive for shortness of breath.   Cardiovascular: Negative for chest pain.  Skin: Negative for rash.  Allergic/Immunologic: Negative for immunocompromised state.  Neurological: Negative for dizziness and weakness.  Psychiatric/Behavioral: Negative for agitation, hallucinations, self-injury and suicidal ideas. The patient is nervous/anxious.   All other systems reviewed and are negative.    Physical Exam Updated Vital Signs BP (!) 156/65 (BP Location: Right Arm)   Pulse 66   Temp 98.8 F (37.1 C) (Oral)   Resp 20   SpO2 99%   Physical Exam Vitals signs and nursing note reviewed.  Constitutional:      General: He is not in acute distress.    Appearance: He is well-developed. He is not diaphoretic.  HENT:     Head: Normocephalic and atraumatic.  Cardiovascular:     Rate and Rhythm: Normal rate and regular rhythm.     Heart sounds: Normal heart sounds.  Pulmonary:     Effort: Pulmonary effort is normal.     Breath sounds: Normal breath sounds.  Skin:    General: Skin is warm and dry.     Findings: No rash.  Neurological:     Mental Status: He is alert and oriented to person, place, and time.  Psychiatric:        Mood and Affect: Mood and affect normal.        Speech: Speech normal.        Behavior: Behavior normal.        Thought Content: Thought content is not paranoid. Thought content does not include homicidal or suicidal ideation. Thought content does not include homicidal or suicidal plan.        Cognition and Memory: Cognition and  memory normal.        Judgment: Judgment normal.      ED Treatments / Results  Labs (all labs ordered are listed, but only abnormal results are displayed) Labs Reviewed - No data to display  EKG None  Radiology No results found.  Procedures Procedures (including critical care time)  Medications Ordered in ED Medications - No data to display   Initial Impression / Assessment and Plan / ED Course  I have reviewed the triage vital signs and the nursing notes.  Pertinent labs & imaging results that were available during my care of the patient were reviewed by me and considered in my medical decision making (see chart for details).  Clinical Course as of Apr 21  [id number]  Tue Jan 15, 2019  [id number] 23 year old male presents with complaint of anxiety attack, history of same.  Patient denies suicidal or homicidal ideation.  Symptoms have resolved upon evaluation.  Patient has not tried hydroxyzine in the past.  Given prescription for hydroxyzine with referral list for outpatient follow-up.  Patient to return to ER for new or worsening symptoms.   [LM]    Clinical Course User Index [LM] Jeannie FendMurphy, Harrel Ferrone A, PA-C      Final Clinical Impressions(s) / ED Diagnoses   Final diagnoses:  Anxiety    ED Discharge Orders         Ordered    hydrOXYzine (ATARAX/VISTARIL) 25 MG tablet  Every 6 hours     01/15/19 2358           Jeannie FendMurphy, Kent Riendeau A, PA-C 01/16/19 0001    Dione BoozeGlick, David, MD 01/16/19 [phone]

## 2019-01-15 NOTE — ED Notes (Addendum)
Pt reports panic attacks w/ symptoms including: shaking, breathing hard, crying.  Denies SI/HI.  Pt has had panic attacks since the age of 44.  Pt states over the past 4 years it has gotten worse.  Normal coping skills not working or "can't do them."  Situational stressors w/ work and housing.  Also states a "tramatic situation happened a year ago... I have been thinking about it"

## 2020-11-02 IMAGING — CR DG SHOULDER 2+V*R*
1 series · 3 of 3 positions shown · non-contrast
Comparison: None.

CLINICAL DATA: Pt states he was wrestling last night and fell onto
right shoulder. Right sided neck pain radiating into right shoulder.
Pain initially radiated to his right fingers.

EXAM:
RIGHT SHOULDER - 2+ VIEW

[Series 1: dg shoulder right · 0.14mm/px · 3 of 3 slices shown]
[im 1/3]
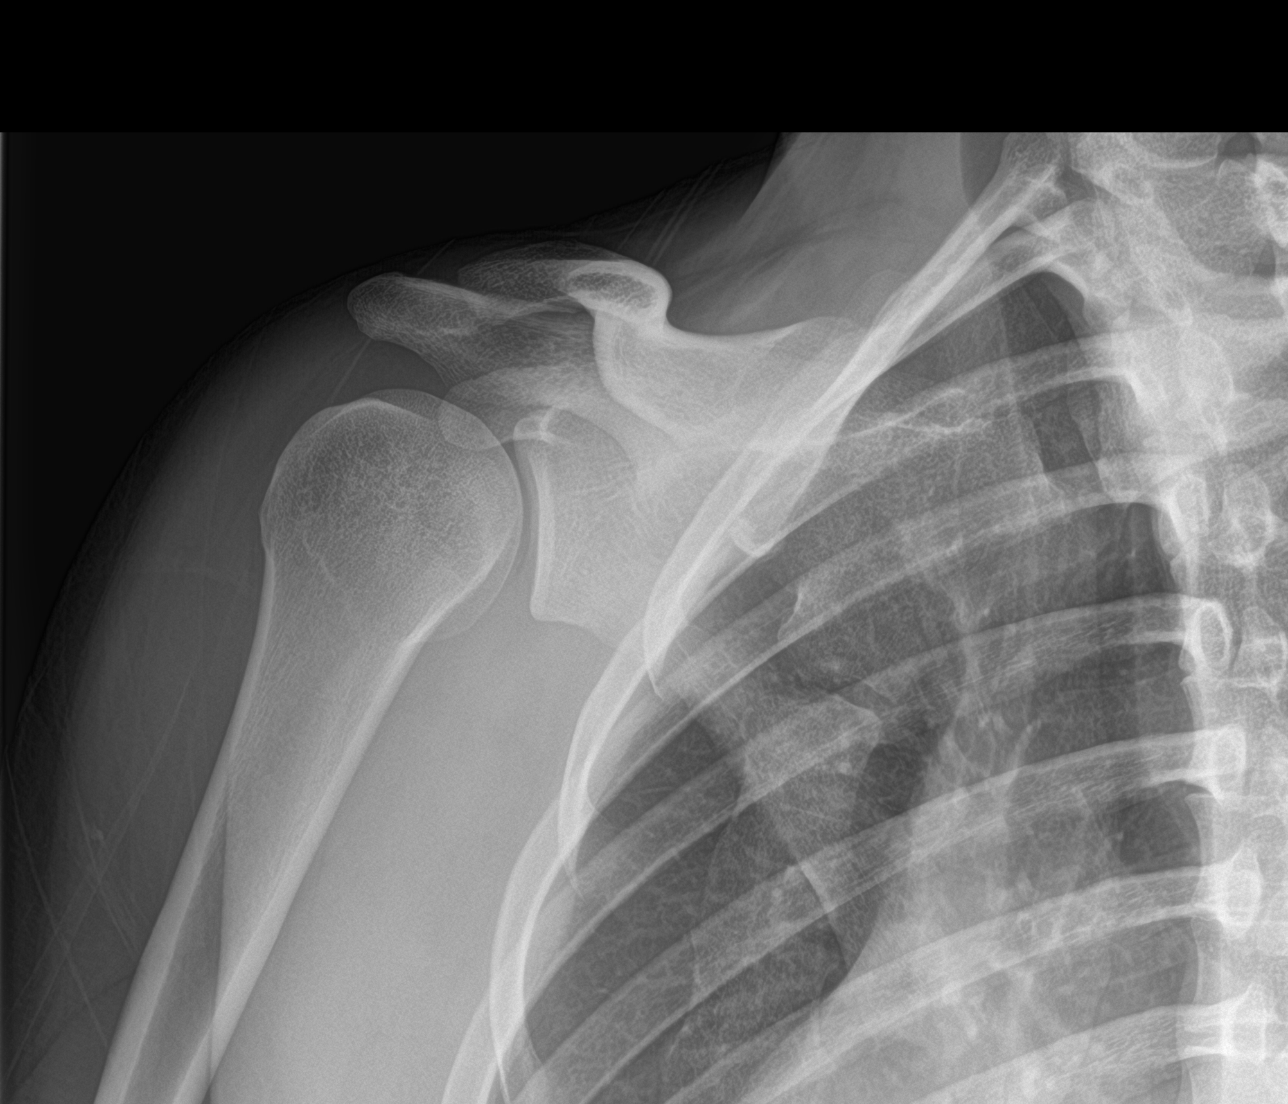
[im 2/3]
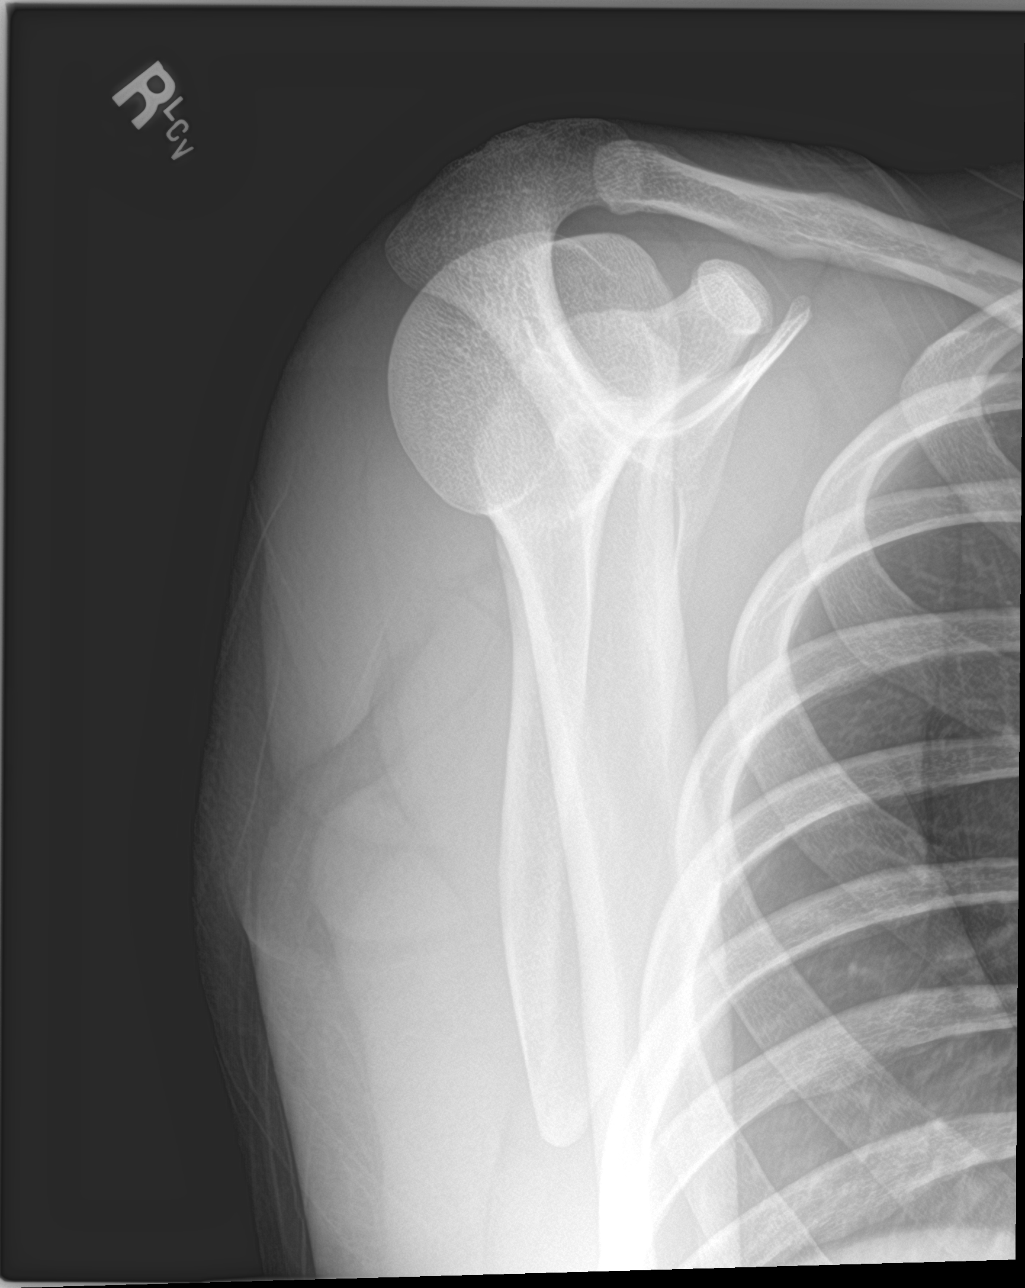
[im 3/3]
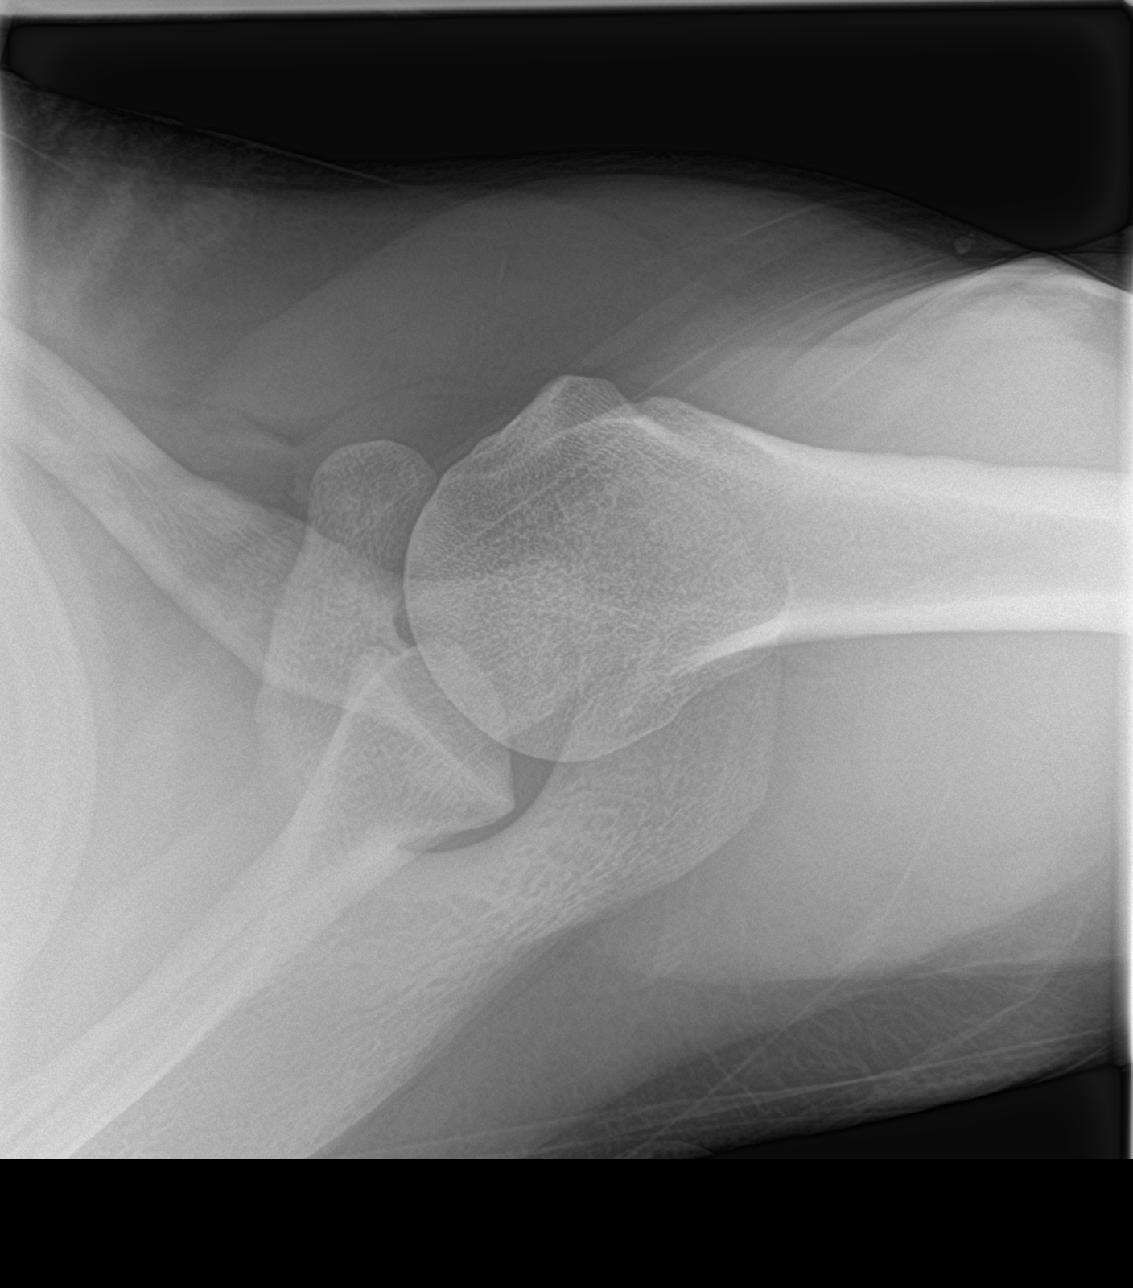

[3 of 3 positions shown; findings below may reference images not displayed]

FINDINGS: There is no evidence of fracture or dislocation. There is no
evidence of arthropathy or other focal bone abnormality. Soft
tissues are unremarkable.
IMPRESSION: Negative.

## 2021-01-29 ENCOUNTER — Ambulatory Visit: Payer: Self-pay | Admitting: Family Medicine

## 2022-03-01 ENCOUNTER — Encounter: Payer: Self-pay | Admitting: *Deleted

## 2024-07-23 ENCOUNTER — Encounter: Payer: Self-pay | Admitting: Emergency Medicine

## 2024-07-23 ENCOUNTER — Ambulatory Visit: Admission: EM | Admit: 2024-07-23 | Discharge: 2024-07-23 | Disposition: A | Discharge status: Home / Self Care

## 2024-07-23 DIAGNOSIS — J020 Streptococcal pharyngitis: Secondary | ICD-10-CM | POA: Diagnosis not present

## 2024-07-23 DIAGNOSIS — J029 Acute pharyngitis, unspecified: Secondary | ICD-10-CM

## 2024-07-23 LAB — POCT RAPID STREP A (OFFICE): Rapid Strep A Screen: POSITIVE — AB

## 2024-07-23 MED ORDER — LIDOCAINE VISCOUS HCL 2 % MT SOLN: 15.0000 mL | OROMUCOSAL | 0 refills | Status: AC | PRN

## 2024-07-23 MED ORDER — AMOXICILLIN 500 MG PO CAPS: 500.0000 mg | ORAL_CAPSULE | Freq: Two times a day (BID) | ORAL | 0 refills | Status: AC

## 2024-07-23 MED ORDER — IBUPROFEN 800 MG PO TABS: 800.0000 mg | ORAL_TABLET | Freq: Three times a day (TID) | ORAL | 0 refills | Status: AC

## 2024-07-23 NOTE — ED Triage Notes (Signed)
 Pt presents c/o sore throat x 6 days. Pt states,  So I was sick the past few days. Now I kinda feel fine except for my throat. It's pretty bad. I had like the chills the last couple days. So..  Pt denies emesis and diarrhea.

## 2024-07-23 NOTE — ED Provider Notes (Signed)
 [name]    CSN: [id number] Arrival date & time: 07/23/24  1832      History   Chief Complaint Chief Complaint  Patient presents with   Sore Throat    HPI Vincent Valenzuela is a 28 y.o. male.   6 day history of sore throat He had other viral symptoms at onset that resolved, but the throat pain has persisted. Rates 9/10 pain with swallowing.  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 No pertinent surgical history.     Home Medications    Prior to Admission medications   Medication Sig Start Date End Date Taking? Authorizing Provider  acetaminophen  (TYLENOL ) 500 MG tablet Take 500 mg by mouth every 6 (six) hours as needed for moderate pain (pain score 4-6).   Yes [provider]  amoxicillin (AMOXIL) 500 MG capsule Take 1 capsule (500 mg total) by mouth 2 (two) times daily for 7 days. 07/23/24 07/30/24 Yes Landers Prajapati, Asberry, PA-C  ibuprofen  (ADVIL ) 800 MG tablet Take 1 tablet (800 mg total) by mouth 3 (three) times daily. 07/23/24  Yes Zenovia Justman, PA-C  lidocaine (XYLOCAINE) 2 % solution Use as directed 15 mLs  in the mouth or throat every 3 (three) hours as needed for mouth pain. Swish/gargle and spit out 07/23/24  Yes Shalik Sanfilippo, Asberry, PA-C  busPIRone  (BUSPAR ) 7.5 MG tablet Take 1 tablet (7.5 mg total) by mouth 2 (two) times daily. 11/28/17   Delane Lye, MD  cyclobenzaprine  (FLEXERIL ) 5 MG tablet Take 1-2 tablets (5-10 mg total) by mouth 3 (three) times daily as needed for muscle spasms. 09/23/18   Charlene Debby BROCKS, PA-C  fluvoxaMINE  (LUVOX ) 100 MG tablet Take 1 tablet (100 mg total) by mouth at bedtime. 05/03/18   Delane Lye, MD  hydrOXYzine  (ATARAX /VISTARIL ) 25 MG tablet Take 1 tablet (25 mg total) by mouth every 6 (six) hours. 01/15/19   Beverley Leita LABOR, PA-C  Multiple Vitamin (MULTIVITAMIN) tablet Take 1 tablet by mouth daily.    [provider]  QUEtiapine  (SEROQUEL ) 25 MG tablet TAKE ONE TABLET BY MOUTH TWICE A DAY FOR MOOD CONTROL 05/03/18   Delane Lye, MD  Specialty Vitamins Products (MG PLUS PROTEIN) 133 MG TABS Take 1 tablet by mouth 2 (two) times daily.    [provider]  traZODone  (DESYREL ) 50 MG tablet Take 1 tablet (50 mg total) by mouth at bedtime as needed for sleep. 08/24/17   Money, Caron NOVAK, FNP    Family History Family History  Problem Relation Age of Onset   Lupus Mother    Depression Mother    Depression Father    Alcohol abuse Father  Hypertension Father    Lupus Maternal Grandmother    Diabetes Maternal Grandmother    Hypertension Maternal Grandmother    Hyperlipidemia Maternal Grandmother    Diabetes Maternal Grandfather    Hypertension Maternal Grandfather    Hyperlipidemia Maternal Grandfather    Diabetes Paternal Grandmother    Hypertension Paternal Grandmother    Hyperlipidemia Paternal Grandmother    Diabetes Paternal Grandfather    Hypertension Paternal Grandfather    Hyperlipidemia Paternal Grandfather     Social History Social History   Tobacco Use   Smoking status: Never    Passive exposure: Never   Smokeless tobacco:  Never  Vaping Use   Vaping status: Never Used  Substance Use Topics   Alcohol use: No   Drug use: No     Allergies   Patient has no known allergies.   Review of Systems Review of Systems  As per HPI  Physical Exam Triage Vital Signs ED Triage Vitals  Encounter Vitals Group     BP 07/23/24 1915 110/71     Girls Systolic BP Percentile --      Girls Diastolic BP Percentile --      Boys Systolic BP Percentile --      Boys Diastolic BP Percentile --      Pulse Rate 07/23/24 1915 73     Resp 07/23/24 1915 16     Temp 07/23/24 1915 98.6 F (37 C)     Temp Source 07/23/24 1915 Oral     SpO2 07/23/24 1915 97 %     Weight 07/23/24 1912 202 lb (91.6 kg)     Height --      Head Circumference --      Peak Flow --      Pain Score 07/23/24 1911 9     Pain Loc --      Pain Education --      Exclude from Growth Chart --    No data found.  Updated Vital Signs BP 110/71 (BP Location: Left Arm)   Pulse 73   Temp 98.6 F (37 C) (Oral)   Resp 16   Wt 202 lb (91.6 kg)   SpO2 97%   BMI 28.17 kg/m    Physical Exam Vitals and nursing note reviewed.  Constitutional:      Appearance: He is not ill-appearing.  HENT:     Right Ear: Tympanic membrane and ear canal normal.     Left Ear: Tympanic membrane and ear canal normal.     Nose: No congestion or rhinorrhea.     Mouth/Throat:     Mouth: Mucous membranes are moist.     Pharynx: Oropharynx is clear. Uvula midline. Posterior oropharyngeal erythema present.     Tonsils: Tonsillar exudate present. No tonsillar abscesses. 2+ on the right. 2+ on the left.  Eyes:     Conjunctiva/sclera: Conjunctivae normal.  Cardiovascular:     Rate and Rhythm: Normal rate and regular rhythm.     Pulses: Normal pulses.     Heart sounds: Normal heart sounds.  Pulmonary:     Effort: Pulmonary effort is normal.     Breath sounds: Normal breath sounds.  Abdominal:     Palpations: Abdomen is soft.     Tenderness: There is no abdominal  tenderness.  Musculoskeletal:     Cervical back: Normal range of motion.  Lymphadenopathy:     Cervical: No cervical adenopathy.  Skin:    General: Skin is warm and dry.  Neurological:  Mental Status: He is alert and oriented to person, place, and time.     UC Treatments / Results  Labs (all labs ordered are listed, but only abnormal results are displayed) Labs Reviewed  POCT RAPID STREP A (OFFICE) - Abnormal; Notable for the following components:      Result Value   Rapid Strep A Screen Positive (*)    All other components within normal limits    EKG  Radiology No results found.  Procedures Procedures (including critical care time)  Medications Ordered in UC Medications - No data to display  Initial Impression / Assessment and Plan / UC Course  I have reviewed the triage vital signs and the nursing notes.  Pertinent labs & imaging results that were available during my care of the patient were reviewed by me and considered in my medical decision making (see chart for details).  Afebrile, well appearing Rapid strep positive.  Amoxicillin BID x 10 days, ibuprofen , tylenol , lidocaine gargle and spit No questions at this time, agrees to plan  Return if needed  Final Clinical Impressions(s) / UC Diagnoses   Final diagnoses:  Sore throat  Strep pharyngitis     Discharge Instructions      Your strep test was positive. Please take the medication for the full 10 days. It's important to finish all 10 days even when you are feeling better. Otherwise the infection can come back worse. Take with food to avoid upset stomach. Make sure to change out your toothbrush! Continue other symptomatic care as needed for pain -- ibuprofen , tylenol , lidocaine gargle to numb the throat     ED Prescriptions     Medication Sig Dispense Auth. Provider   amoxicillin (AMOXIL) 500 MG capsule Take 1 capsule (500 mg total) by mouth 2 (two) times daily for 7 days. 14 capsule Huey Scalia,  Shelina Luo, PA-C   lidocaine (XYLOCAINE) 2 % solution Use as directed 15 mLs in the mouth or throat every 3 (three) hours as needed for mouth pain. Swish/gargle and spit out 100 mL Sora Vrooman, PA-C   ibuprofen  (ADVIL ) 800 MG tablet Take 1 tablet (800 mg total) by mouth 3 (three) times daily. 21 tablet Jorrell Kuster, Asberry, PA-C      PDMP not reviewed this encounter.   Jeryl Asberry, [place] 07/23/24 1958

## 2024-07-23 NOTE — Discharge Instructions (Addendum)
 Your strep test was positive. Please take the medication for the full 10 days. It's important to finish all 10 days even when you are feeling better. Otherwise the infection can come back worse. Take with food to avoid upset stomach. Make sure to change out your toothbrush! Continue other symptomatic care as needed for pain -- ibuprofen , tylenol , lidocaine gargle to numb the throat
